# Patient Record
Sex: Female | Born: 1985 | Race: Black or African American | Hispanic: No | Marital: Single | Smoking: Never smoker
Health system: Southern US, Community
[De-identification: ages and names within clinical notes are randomized; demographics above are authoritative.]

## PROBLEM LIST (undated history)

## (undated) DIAGNOSIS — R002 Palpitations: Secondary | ICD-10-CM

## (undated) DIAGNOSIS — L2089 Other atopic dermatitis: Secondary | ICD-10-CM

## (undated) DIAGNOSIS — G43909 Migraine, unspecified, not intractable, without status migrainosus: Secondary | ICD-10-CM

## (undated) DIAGNOSIS — A749 Chlamydial infection, unspecified: Secondary | ICD-10-CM

## (undated) DIAGNOSIS — A549 Gonococcal infection, unspecified: Secondary | ICD-10-CM

## (undated) DIAGNOSIS — E669 Obesity, unspecified: Secondary | ICD-10-CM

## (undated) DIAGNOSIS — G4730 Sleep apnea, unspecified: Secondary | ICD-10-CM

## (undated) DIAGNOSIS — D649 Anemia, unspecified: Secondary | ICD-10-CM

## (undated) HISTORY — DX: Other atopic dermatitis: L20.89

## (undated) HISTORY — DX: Palpitations: R00.2

## (undated) HISTORY — DX: Obesity, unspecified: E66.9

---
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
## 2005-12-29 ENCOUNTER — Emergency Department (HOSPITAL_COMMUNITY): Admission: EM | Admit: 2005-12-29 | Discharge: 2005-12-29 | Payer: Self-pay | Admitting: Emergency Medicine

## 2005-12-29 ENCOUNTER — Emergency Department (HOSPITAL_COMMUNITY): Admission: EM | Admit: 2005-12-29 | Discharge: 2005-12-29 | Payer: Self-pay | Admitting: Family Medicine

## 2006-10-20 ENCOUNTER — Inpatient Hospital Stay (HOSPITAL_COMMUNITY): Admission: AD | Admit: 2006-10-20 | Discharge: 2006-10-20 | Payer: Self-pay

## 2006-10-21 ENCOUNTER — Inpatient Hospital Stay (HOSPITAL_COMMUNITY): Admission: AD | Admit: 2006-10-21 | Discharge: 2006-10-21 | Payer: Self-pay | Admitting: Gynecology

## 2006-12-02 DIAGNOSIS — E6609 Other obesity due to excess calories: Secondary | ICD-10-CM | POA: Insufficient documentation

## 2006-12-02 DIAGNOSIS — E669 Obesity, unspecified: Secondary | ICD-10-CM

## 2006-12-02 DIAGNOSIS — L2089 Other atopic dermatitis: Secondary | ICD-10-CM

## 2006-12-02 HISTORY — DX: Other atopic dermatitis: L20.89

## 2006-12-02 HISTORY — DX: Obesity, unspecified: E66.9

## 2008-01-04 ENCOUNTER — Inpatient Hospital Stay (HOSPITAL_COMMUNITY): Admission: AD | Admit: 2008-01-04 | Discharge: 2008-01-04 | Payer: Self-pay | Admitting: Obstetrics & Gynecology

## 2008-09-06 ENCOUNTER — Emergency Department (HOSPITAL_COMMUNITY): Admission: EM | Admit: 2008-09-06 | Discharge: 2008-09-06 | Payer: Self-pay | Admitting: Emergency Medicine

## 2008-10-05 DIAGNOSIS — O3110X0 Continuing pregnancy after spontaneous abortion of one fetus or more, unspecified trimester, not applicable or unspecified: Secondary | ICD-10-CM

## 2008-10-16 ENCOUNTER — Emergency Department (HOSPITAL_COMMUNITY): Admission: EM | Admit: 2008-10-16 | Discharge: 2008-10-17 | Payer: Self-pay | Admitting: Emergency Medicine

## 2008-10-17 ENCOUNTER — Emergency Department (HOSPITAL_COMMUNITY): Admission: EM | Admit: 2008-10-17 | Discharge: 2008-10-17 | Payer: Self-pay | Admitting: Emergency Medicine

## 2008-11-28 ENCOUNTER — Emergency Department (HOSPITAL_COMMUNITY): Admission: EM | Admit: 2008-11-28 | Discharge: 2008-11-28 | Payer: Self-pay | Admitting: Family Medicine

## 2009-01-22 ENCOUNTER — Ambulatory Visit: Payer: Self-pay | Admitting: Obstetrics and Gynecology

## 2009-01-22 ENCOUNTER — Inpatient Hospital Stay: Admission: AD | Admit: 2009-01-22 | Discharge: 2009-01-22 | Payer: Self-pay | Admitting: Obstetrics & Gynecology

## 2009-08-06 ENCOUNTER — Inpatient Hospital Stay (HOSPITAL_COMMUNITY): Admission: AD | Admit: 2009-08-06 | Discharge: 2009-08-06 | Payer: Self-pay | Admitting: Obstetrics and Gynecology

## 2009-09-27 ENCOUNTER — Inpatient Hospital Stay (HOSPITAL_COMMUNITY): Admission: AD | Admit: 2009-09-27 | Discharge: 2009-09-29 | Payer: Self-pay | Admitting: Obstetrics and Gynecology

## 2010-01-14 ENCOUNTER — Emergency Department (HOSPITAL_COMMUNITY): Admission: EM | Admit: 2010-01-14 | Discharge: 2010-01-14 | Payer: Self-pay | Admitting: Family Medicine

## 2011-01-05 LAB — CBC
HCT: 28 % — ABNORMAL LOW (ref 36.0–46.0)
Hemoglobin: 8.9 g/dL — ABNORMAL LOW (ref 12.0–15.0)
MCHC: 31.6 g/dL (ref 30.0–36.0)
Platelets: 135 10*3/uL — ABNORMAL LOW (ref 150–400)
RBC: 3.73 MIL/uL — ABNORMAL LOW (ref 3.87–5.11)
RBC: 4.64 MIL/uL (ref 3.87–5.11)
WBC: 7.5 10*3/uL (ref 4.0–10.5)

## 2011-01-07 LAB — URINALYSIS, ROUTINE W REFLEX MICROSCOPIC
Glucose, UA: NEGATIVE mg/dL
Protein, ur: NEGATIVE mg/dL
Specific Gravity, Urine: <1.005 — ABNORMAL LOW (ref 1.005–1.030)
Urobilinogen, UA: 0.2 mg/dL (ref 0.0–1.0)
pH: 7 (ref 5.0–8.0)

## 2011-01-07 LAB — URINE MICROSCOPIC-ADD ON

## 2011-01-07 LAB — WET PREP, GENITAL
Clue Cells Wet Prep HPF POC: NONE SEEN
Yeast Wet Prep HPF POC: NONE SEEN

## 2011-01-07 LAB — FETAL FIBRONECTIN: Fetal Fibronectin: NEGATIVE

## 2011-01-20 LAB — POCT URINALYSIS DIP (DEVICE)
Bilirubin Urine: NEGATIVE
Glucose, UA: NEGATIVE mg/dL
Ketones, ur: NEGATIVE mg/dL
Protein, ur: 100 mg/dL — AB
Specific Gravity, Urine: 1.015 (ref 1.005–1.030)

## 2011-01-20 LAB — POCT PREGNANCY, URINE: Preg Test, Ur: NEGATIVE

## 2011-02-22 ENCOUNTER — Inpatient Hospital Stay (HOSPITAL_COMMUNITY)
Admission: AD | Admit: 2011-02-22 | Discharge: 2011-02-23 | Disposition: A | Discharge status: Home / Self Care | Payer: Self-pay | Source: Ambulatory Visit | Attending: Obstetrics & Gynecology | Admitting: Obstetrics & Gynecology

## 2011-02-22 DIAGNOSIS — Z3202 Encounter for pregnancy test, result negative: Secondary | ICD-10-CM

## 2011-03-07 ENCOUNTER — Inpatient Hospital Stay (HOSPITAL_COMMUNITY)
Admission: RE | Admit: 2011-03-07 | Discharge: 2011-03-07 | Disposition: A | Discharge status: Home / Self Care | Payer: Self-pay | Source: Ambulatory Visit | Attending: Family Medicine | Admitting: Family Medicine

## 2011-03-07 ENCOUNTER — Inpatient Hospital Stay (HOSPITAL_COMMUNITY): Payer: Self-pay

## 2011-03-07 ENCOUNTER — Inpatient Hospital Stay (HOSPITAL_COMMUNITY)
Admission: EM | Admit: 2011-03-07 | Discharge: 2011-03-07 | Disposition: A | Discharge status: Home / Self Care | Payer: Self-pay | Source: Ambulatory Visit | Attending: Obstetrics & Gynecology | Admitting: Obstetrics & Gynecology

## 2011-03-07 DIAGNOSIS — R109 Unspecified abdominal pain: Secondary | ICD-10-CM

## 2011-03-07 DIAGNOSIS — D649 Anemia, unspecified: Secondary | ICD-10-CM

## 2011-03-07 LAB — CBC
MCH: 18.5 pg — ABNORMAL LOW (ref 26.0–34.0)
MCHC: 27.7 g/dL — ABNORMAL LOW (ref 30.0–36.0)
Platelets: 284 10*3/uL (ref 150–400)
RBC: 4.64 MIL/uL (ref 3.87–5.11)

## 2011-03-07 LAB — URINALYSIS, ROUTINE W REFLEX MICROSCOPIC
Ketones, ur: NEGATIVE mg/dL
Nitrite: NEGATIVE

## 2011-03-07 LAB — URINE MICROSCOPIC-ADD ON

## 2011-03-07 LAB — WET PREP, GENITAL: Yeast Wet Prep HPF POC: NONE SEEN

## 2011-03-07 LAB — POCT PREGNANCY, URINE: Preg Test, Ur: NEGATIVE

## 2011-03-09 LAB — GC/CHLAMYDIA PROBE AMP, GENITAL: GC Probe Amp, Genital: POSITIVE — AB

## 2011-03-30 ENCOUNTER — Ambulatory Visit: Payer: Self-pay | Admitting: Obstetrics and Gynecology

## 2011-06-30 LAB — GC/CHLAMYDIA PROBE AMP, GENITAL
Chlamydia, DNA Probe: NEGATIVE
GC Probe Amp, Genital: NEGATIVE

## 2011-06-30 LAB — WET PREP, GENITAL
Clue Cells Wet Prep HPF POC: NONE SEEN
Yeast Wet Prep HPF POC: NONE SEEN

## 2011-09-02 ENCOUNTER — Emergency Department (INDEPENDENT_AMBULATORY_CARE_PROVIDER_SITE_OTHER)
Admission: EM | Admit: 2011-09-02 | Discharge: 2011-09-02 | Disposition: A | Discharge status: Home / Self Care | Payer: Self-pay | Source: Home / Self Care | Attending: Family Medicine | Admitting: Family Medicine

## 2011-09-02 DIAGNOSIS — J069 Acute upper respiratory infection, unspecified: Secondary | ICD-10-CM

## 2011-09-02 DIAGNOSIS — H109 Unspecified conjunctivitis: Secondary | ICD-10-CM

## 2011-09-02 MED ORDER — POLYMYXIN B-TRIMETHOPRIM 10000-0.1 UNIT/ML-% OP SOLN: 1.0000 [drp] | OPHTHALMIC | Status: AC

## 2011-09-02 NOTE — ED Notes (Signed)
C/o redness and frequent tearing to lt eye and cold sx since 08/30/11.

## 2011-09-02 NOTE — ED Provider Notes (Signed)
History     CSN: [id number] Arrival date & time: 09/02/2011  8:29 AM   First MD Initiated Contact with Patient 09/02/11 0818      Chief Complaint  Patient presents with  . Eye Problem  . URI    (Consider location/radiation/quality/duration/timing/severity/associated sxs/prior treatment) HPI Comments: Julia Roberts presents for evaluation of redness and tearing in her LEFT eye since yesterday. She reports watery discharge and the eye was difficult to open this morning. She also reports URI sx with nasal congestion, runny nose, cough. She also reports hx of allergies.   Patient is a 25 y.o. female presenting with eye problem and URI. The history is provided by the patient.  Eye Problem  This is a new problem. The current episode started yesterday. The problem occurs constantly. The problem has not changed since onset.There is pain in the left eye. There was no injury mechanism. The patient is experiencing no pain. There is no history of trauma to the eye. There is no known exposure to pink eye. She does not wear contacts. Associated symptoms include discharge and eye redness. Pertinent negatives include no blurred vision, no decreased vision, no double vision and no photophobia. She has tried nothing for the symptoms.  URI The primary symptoms include cough. Primary symptoms do not include sore throat.  Symptoms associated with the illness include congestion and rhinorrhea.    History reviewed. No pertinent past medical history.  History reviewed. No pertinent past surgical history.  No family history on file.  History  Substance Use Topics  . Smoking status: Never Smoker   . Smokeless tobacco: Not on file  . Alcohol Use: No    OB History    Grav Para Term Preterm Abortions TAB SAB Ect Mult Living                  Review of Systems  Constitutional: Negative.   HENT: Positive for congestion, rhinorrhea and sneezing. Negative for sore throat.   Eyes: Positive for discharge and  redness. Negative for blurred vision, double vision, photophobia and visual disturbance.  Respiratory: Positive for cough.   Cardiovascular: Negative.   Gastrointestinal: Negative.   Genitourinary: Negative.   Musculoskeletal: Negative.   Neurological: Negative.     Allergies  Review of patient's allergies indicates no known allergies.  Home Medications   Current Outpatient Rx  Name Route Sig Dispense Refill  . ALEVE PO Oral Take by mouth as needed.        BP 110/70  Pulse 69  Temp(Src) 98.3 F (36.8 C) (Oral)  Resp 16  SpO2 100%  Physical Exam  Nursing note and vitals reviewed. Constitutional: She is oriented to person, place, and time. She appears well-developed and well-nourished.  HENT:  Head: Normocephalic and atraumatic.  Right Ear: Tympanic membrane and external ear normal.  Left Ear: Tympanic membrane and external ear normal.  Mouth/Throat: Uvula is midline, oropharynx is clear and moist and mucous membranes are normal.  Eyes: EOM are normal. Pupils are equal, round, and reactive to light. Left eye exhibits discharge. Left conjunctiva is injected.    Neck: Normal range of motion.  Cardiovascular: Normal rate and regular rhythm.   Pulmonary/Chest: Effort normal and breath sounds normal.  Neurological: She is alert and oriented to person, place, and time.  Skin: Skin is warm and dry.    ED Course  Procedures (including critical care time)  Labs Reviewed - No data to display No results found.   No diagnosis found.  MDM  Likely viral conjunctivitis given constellation of other URI sx and hx of allergies but will cover for bacterial pathogens.        Richardo Priest, MD 09/02/11 [phone]

## 2011-11-01 ENCOUNTER — Emergency Department (HOSPITAL_COMMUNITY): Payer: Self-pay

## 2011-11-01 ENCOUNTER — Encounter (HOSPITAL_COMMUNITY): Payer: Self-pay | Admitting: *Deleted

## 2011-11-01 ENCOUNTER — Emergency Department (HOSPITAL_COMMUNITY)
Admission: EM | Admit: 2011-11-01 | Discharge: 2011-11-01 | Disposition: A | Discharge status: Home / Self Care | Payer: Self-pay | Attending: Emergency Medicine | Admitting: Emergency Medicine

## 2011-11-01 DIAGNOSIS — G43109 Migraine with aura, not intractable, without status migrainosus: Secondary | ICD-10-CM | POA: Insufficient documentation

## 2011-11-01 HISTORY — DX: Migraine, unspecified, not intractable, without status migrainosus: G43.909

## 2011-11-01 MED ORDER — METOCLOPRAMIDE HCL 5 MG/ML IJ SOLN
10.0000 mg | Freq: Once | INTRAMUSCULAR | Status: AC
  Administered 2011-11-01: 10 mg via INTRAMUSCULAR
  Filled 2011-11-01: qty 2

## 2011-11-01 MED ORDER — IBUPROFEN 800 MG PO TABS: 800.0000 mg | ORAL_TABLET | Freq: Three times a day (TID) | ORAL | Status: AC

## 2011-11-01 MED ORDER — KETOROLAC TROMETHAMINE 60 MG/2ML IM SOLN
60.0000 mg | Freq: Once | INTRAMUSCULAR | Status: AC
  Administered 2011-11-01: 60 mg via INTRAMUSCULAR
  Filled 2011-11-01: qty 2

## 2011-11-01 NOTE — ED Provider Notes (Signed)
Medical screening examination/treatment/procedure(s) were conducted as a shared visit with non-physician practitioner(s) and myself.  I personally evaluated the patient during the encounter   Per pt, c/o gradual onset and persistence of constant acute flair of her chronic migraine headache since this morning.  Describes the headache as per her usual chronic migraine headache pain pattern x11 years.  Denies headache was sudden or maximal in onset or at any time.  Has been assoc with nausea and "blurry" vision.  Denies focal motor weakness, no tingling/numbness in extremities, no fevers, no neck pain, no rash.   Hx migraines, typical migraine for her today, seen in ED previously for same.  VSS, afebrile, neck supple, CTA, RRR, neuro exam non-focal, no facial droop, speech clear, normal coordination, MAE without focal deficits.  Tx meds, d/c home.  Pt encouraged to keep headache diary and establish with PMD for continuity of care.    Laray Anger, DO 11/01/11 1951

## 2011-11-01 NOTE — ED Provider Notes (Signed)
History     CSN: [id number]  Arrival date & time 11/01/11  1214   First MD Initiated Contact with Patient 11/01/11 1249      Chief Complaint  Patient presents with  . Blurred Vision  . Headache  . Nausea    (Consider location/radiation/quality/duration/timing/severity/associated sxs/prior treatment) HPI History provided by pt.   Pt has h/o migraines but has never been evaluated by a neurologist nor had a CT head.  Most recent migraine one month ago.  Today she developed blurred vision while at work and then a severe, frontal headache w/ associated dizziness, photophobia and nausea 5 minutes later.  Pain similar to past headaches w/ exception of prodrome of blurred vision.  Denies fever and recent head trauma.   Per prior chart, pt has been seen in ED in the past w/ c/o migraine w/ vision changes.   Past Medical History  Diagnosis Date  . Migraines     History reviewed. No pertinent past surgical history.  No family history on file.  History  Substance Use Topics  . Smoking status: Never Smoker   . Smokeless tobacco: Not on file  . Alcohol Use: No    OB History    Grav Para Term Preterm Abortions TAB SAB Ect Mult Living                  Review of Systems  All other systems reviewed and are negative.    Allergies  Review of patient's allergies indicates no known allergies.  Home Medications  No current outpatient prescriptions on file.  BP 119/42  Pulse 72  Temp(Src) 98.4 F (36.9 C) (Oral)  Resp 20  SpO2 100%  Physical Exam  Nursing note and vitals reviewed. Constitutional: She is oriented to person, place, and time. She appears well-developed and well-nourished. No distress.  HENT:  Head: Normocephalic and atraumatic.  Eyes:       Normal appearance  Neck: Normal range of motion.  Cardiovascular: Normal rate and regular rhythm.   Pulmonary/Chest: Effort normal and breath sounds normal.  Musculoskeletal: Normal range of motion.  Neurological: She  is alert and oriented to person, place, and time. She has normal reflexes. No cranial nerve deficit or sensory deficit. She displays a negative Romberg sign. Coordination and gait normal.       5/5 and equal upper and lower extremity strength.  No past pointing.  Normal gait.   Skin: Skin is warm and dry. No rash noted.  Psychiatric: She has a normal mood and affect. Her behavior is normal.    ED Course  Procedures (including critical care time)  Labs Reviewed - No data to display Ct Head Wo Contrast  11/01/2011  *RADIOLOGY REPORT*  Clinical Data: 26 year old female with acute onset severe headache, blurred vision and nausea.  CT HEAD WITHOUT CONTRAST  Technique:  Contiguous axial images were obtained from the base of the skull through the vertex without contrast.  Comparison: None.  Findings: Mild maxillary and ethmoid sinus mucosal thickening. Frontal sinuses are hypoplastic, the right frontal recess also shows some bubbly opacity.  The mastoids are hypoplastic and otherwise normal. No acute osseous abnormality identified. Visualized orbits and scalp soft tissues are within normal limits. Adenoid hypertrophy is within normal limits for age.  Incidental cavum septum pellucidum.  No ventriculomegaly.  Dural calcifications. No acute intracranial hemorrhage identified.  No midline shift, mass effect, or evidence of mass lesion.  No evidence of cortically based acute infarction identified.  Normal gray-white matter  differentiation throughout the brain. No suspicious intracranial vascular hyperdensity.  IMPRESSION: 1. Normal noncontrast CT appearance of the brain. 2.  Paranasal sinus inflammatory changes, could reflect acute sinusitis in the appropriate clinical setting.  Original Report Authenticated By: Harley Hallmark, M.D.     1. Migraine with aura       MDM  Pt w/ h/o migraines presents w/ typical headache, with exception of prodrome of blurred vision.  No recent head trauma.  On exam, afebrile,  no meningeal signs or focal neuro deficits. Pt received IM reglan for pain/nausea.  CT head ordered because she has never been evaluated for these headaches by a neurologist or PCP and blurred vision reportedly atypical for her.  1:11 PM   CT head neg w/ exception of possible paranasal sinusitis.  Pt has had nasal congestion and facial pressure recently.  I explained that sinusitis in generally viral and no treatment necessary at this time other than sudafed.  She reports that pain is only mildly improved w/ IM reglan.  IM toradol ordered.  Will d/c home w/ ibuprofen and referral to neuro.  Return precautions discussed.  2:46 PM         Otilio Miu, PA 11/01/11 1446

## 2011-11-01 NOTE — ED Notes (Signed)
Pt reports headache is better. From 9/10 to 5/10.

## 2011-11-01 NOTE — ED Notes (Signed)
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 Supervisor brought her to triage and blood pressure was found to be 123/71. Pt reports a history of Migraines but is not being treated for them at this time. Pt thinks she may be having another migraine. Pt alert and oriented x 4, neuro intact. Pt denies photophobia or sensitivity to noise at this time. Pt denies any history of strokes in her family. Pt does not smoke.

## 2011-11-01 NOTE — ED Notes (Signed)
CBG 85

## 2011-11-02 LAB — GLUCOSE, CAPILLARY

## 2012-08-26 ENCOUNTER — Inpatient Hospital Stay (HOSPITAL_COMMUNITY)
Admission: AD | Admit: 2012-08-26 | Discharge: 2012-08-26 | Disposition: A | Discharge status: Home / Self Care | Payer: Self-pay | Source: Ambulatory Visit | Attending: Obstetrics & Gynecology | Admitting: Obstetrics & Gynecology

## 2012-08-26 DIAGNOSIS — Z3202 Encounter for pregnancy test, result negative: Secondary | ICD-10-CM | POA: Insufficient documentation

## 2012-08-26 DIAGNOSIS — R109 Unspecified abdominal pain: Secondary | ICD-10-CM | POA: Insufficient documentation

## 2012-08-26 DIAGNOSIS — N912 Amenorrhea, unspecified: Secondary | ICD-10-CM | POA: Insufficient documentation

## 2012-08-26 LAB — URINALYSIS, ROUTINE W REFLEX MICROSCOPIC
Glucose, UA: NEGATIVE mg/dL
Ketones, ur: NEGATIVE mg/dL
Leukocytes, UA: NEGATIVE
Nitrite: NEGATIVE
Protein, ur: NEGATIVE mg/dL

## 2012-08-26 LAB — POCT PREGNANCY, URINE: Preg Test, Ur: NEGATIVE

## 2012-08-26 NOTE — MAU Provider Note (Signed)
S: Pt presents to MAU for pregnancy test.  Her menses are 2 days late.  She has gained weight recently.  She usually has irregular menses.  She denies other pregnancy symptoms or problems at this time.    O:   BP 124/73  Pulse 67  Temp 98.7 F (37.1 C) (Oral)  Resp 18  Ht 5\' 7"  (1.702 m)  Wt 124.195 kg (273 lb 12.8 oz)  BMI 42.88 kg/m2  SpO2 100%  LMP 07/26/2012   Neg preg test  A: 1. Pregnancy test negative     P: D/C home Discussed test results, signs of pregnancy with pt Take home UPT in 1 week if no onset of menses Return to MAU as needed  Sharen Counter Certified Nurse-Midwife

## 2012-08-26 NOTE — MAU Note (Signed)
Patient states she is late for her period and has been having cramping since yesterday.

## 2012-09-04 NOTE — MAU Provider Note (Signed)
Attestation of Attending Supervision of Advanced Practitioner (CNM/NP): Evaluation and management procedures were performed by the Advanced Practitioner under my supervision and collaboration. I have reviewed the Advanced Practitioner's note and chart, and I agree with the management and plan.  Bartolo Montanye H. 2:51 PM   

## 2013-12-04 ENCOUNTER — Inpatient Hospital Stay (HOSPITAL_COMMUNITY)
Admission: AD | Admit: 2013-12-04 | Discharge: 2013-12-04 | Disposition: A | Discharge status: Home / Self Care | Payer: Medicaid Other | Source: Ambulatory Visit | Attending: Obstetrics & Gynecology | Admitting: Obstetrics & Gynecology

## 2013-12-04 DIAGNOSIS — L293 Anogenital pruritus, unspecified: Secondary | ICD-10-CM | POA: Insufficient documentation

## 2013-12-04 DIAGNOSIS — N898 Other specified noninflammatory disorders of vagina: Secondary | ICD-10-CM | POA: Insufficient documentation

## 2013-12-04 NOTE — MAU Provider Note (Signed)
Attestation of Attending Supervision of Advanced Practitioner (CNM/NP): Evaluation and management procedures were performed by the Advanced Practitioner under my supervision and collaboration.  I have reviewed the Advanced Practitioner's note and chart, and I agree with the management and plan.  HARRAWAY-SMITH, Jahlisa Rossitto 2:49 PM     

## 2013-12-04 NOTE — MAU Note (Signed)
Patient states she has had vaginal itching with a clear/white vaginal discharge. Denies pain or bleeding.

## 2013-12-04 NOTE — MAU Provider Note (Signed)
Ms. Julia Roberts is a 28 y.o. female who presents to MAU today. She states that she has a thick, white discharge with some itching and vaginal irritation. She denies abdominal pain, vaginal bleeding or fever. She would prefer to go to [facility] clinic on 12/06/13 to have this evaluated due to the extended wait time in MAU today.   BP 111/56  Pulse 82  Temp(Src) 98.8 F (37.1 C) (Oral)  Resp 20  Ht 5\' 7"  (1.702 m)  Wt 269 lb 6.4 oz (122.199 kg)  BMI 42.18 kg/m2  SpO2 100%  LMP 11/09/2013 GENERAL: Well-developed, well-nourished female in no acute distress.  HEENT: Normocephalic, atraumatic.   LUNGS: Effort normal HEART: Regular rate  SKIN: Warm, dry and without erythema PSYCH: Normal mood and affect  A: Medical screening exam complete  P: Discharge home Patient has appointment in [facility] clinic on 12/06/13 @ 2:00 pm for further evaluation Patient advised to return to MAU sooner if she develops abdominal pain, vaginal bleeding or fever Patient may return to MAu as needed otherwise  Farris Has, PA-C 12/04/2013 1:14 PM

## 2013-12-06 ENCOUNTER — Encounter (HOSPITAL_COMMUNITY): Payer: Self-pay | Admitting: *Deleted

## 2013-12-06 ENCOUNTER — Encounter: Payer: Medicaid Other | Admitting: Obstetrics & Gynecology

## 2013-12-06 ENCOUNTER — Telehealth: Payer: Self-pay

## 2013-12-06 ENCOUNTER — Inpatient Hospital Stay (HOSPITAL_COMMUNITY)
Admission: AD | Admit: 2013-12-06 | Discharge: 2013-12-06 | Disposition: A | Discharge status: Home / Self Care | Payer: Medicaid Other | Source: Ambulatory Visit | Attending: Obstetrics & Gynecology | Admitting: Obstetrics & Gynecology

## 2013-12-06 DIAGNOSIS — N898 Other specified noninflammatory disorders of vagina: Secondary | ICD-10-CM | POA: Insufficient documentation

## 2013-12-06 DIAGNOSIS — B373 Candidiasis of vulva and vagina: Secondary | ICD-10-CM | POA: Insufficient documentation

## 2013-12-06 DIAGNOSIS — B3731 Acute candidiasis of vulva and vagina: Secondary | ICD-10-CM | POA: Insufficient documentation

## 2013-12-06 DIAGNOSIS — L293 Anogenital pruritus, unspecified: Secondary | ICD-10-CM | POA: Insufficient documentation

## 2013-12-06 HISTORY — DX: Chlamydial infection, unspecified: A74.9

## 2013-12-06 HISTORY — DX: Gonococcal infection, unspecified: A54.9

## 2013-12-06 LAB — URINE MICROSCOPIC-ADD ON

## 2013-12-06 LAB — URINALYSIS, ROUTINE W REFLEX MICROSCOPIC
Bilirubin Urine: NEGATIVE
GLUCOSE, UA: NEGATIVE mg/dL
Hgb urine dipstick: NEGATIVE
Ketones, ur: NEGATIVE mg/dL
Nitrite: NEGATIVE
PH: 6 (ref 5.0–8.0)
Protein, ur: NEGATIVE mg/dL
Specific Gravity, Urine: 1.02 (ref 1.005–1.030)
Urobilinogen, UA: 0.2 mg/dL (ref 0.0–1.0)

## 2013-12-06 LAB — WET PREP, GENITAL
Clue Cells Wet Prep HPF POC: NONE SEEN
Trich, Wet Prep: NONE SEEN

## 2013-12-06 LAB — POCT PREGNANCY, URINE: PREG TEST UR: NEGATIVE

## 2013-12-06 MED ORDER — FLUCONAZOLE 150 MG PO TABS
150.0000 mg | ORAL_TABLET | Freq: Once | ORAL | Status: AC
  Administered 2013-12-06: 150 mg via ORAL
  Filled 2013-12-06: qty 1

## 2013-12-06 MED ORDER — FLUCONAZOLE 150 MG PO TABS: ORAL_TABLET | ORAL | Status: DC

## 2013-12-06 NOTE — Discharge Instructions (Signed)
Candidal Vulvovaginitis  Candidal vulvovaginitis is an infection of the vagina and vulva. The vulva is the skin around the opening of the vagina. This may cause itching and discomfort in and around the vagina.   HOME CARE  · Only take medicine as told by your doctor.  · Do not have sex (intercourse) until the infection is healed or as told by your doctor.  · Practice safe sex.  · Tell your sex partner about your infection.  · Do not douche or use tampons.  · Wear cotton underwear. Do not wear tight pants or panty hose.  · Eat yogurt. This may help treat and prevent yeast infections.  GET HELP RIGHT AWAY IF:   · You have a fever.  · Your problems get worse during treatment or do not get better in 3 days.  · You have discomfort, irritation, or itching in your vagina or vulva area.  · You have pain after sex.  · You start to get belly (abdominal) pain.  MAKE SURE YOU:  · Understand these instructions.  · Will watch your condition.  · Will get help right away if you are not doing well or get worse.  Document Released: 12/18/2008 Document Revised: 12/14/2011 Document Reviewed: 12/18/2008  [employer]® Patient Information ©2014 [employer].

## 2013-12-06 NOTE — Telephone Encounter (Signed)
Pt. Missed todays appointment with Dr. Hulan Fray at 1400.It looks like pt. Was seen in MAU this morning for vaginal discharge, wet prep showed yeast and pt. Was prescribed Diflucan; unsure of the need to follow up today.  Called pt. No answer. Left message stating we saw she missed her appointment, if she continues to have problems or needs a OBGYN please call clinic to reschedule.

## 2013-12-06 NOTE — MAU Provider Note (Signed)
CSN: [id number]     Arrival date & time 12/06/13  1015 History   None    Chief Complaint  Patient presents with  . Vaginal Discharge     (Consider location/radiation/quality/duration/timing/severity/associated sxs/prior Treatment) Patient is a 28 y.o. female presenting with vaginal discharge. The history is provided by the patient.  Vaginal Discharge This is a new problem. The current episode started in the past 7 days. The problem occurs constantly. The problem has been gradually worsening. Pertinent negatives include no abdominal pain, chills, coughing, fever, myalgias, nausea, rash, sore throat, swollen glands or vomiting.   Julia Roberts is a 28 y.o. female who presents to the MAU with vaginal discharge that started 2 days ago. She complains of itching.  She denies any other problems. Current sex partner x 2 years. Hx of GC and Chlamydia.  Past Medical History  Diagnosis Date  . Migraines   . Gonorrhea   . Chlamydia    Past Surgical History  Procedure Laterality Date  . Vaginal delivery  2010   History reviewed. No pertinent family history. History  Substance Use Topics  . Smoking status: Never Smoker   . Smokeless tobacco: Not on file  . Alcohol Use: No   OB History   Grav Para Term Preterm Abortions TAB SAB Ect Mult Living   1 1 1       1      Review of Systems  Constitutional: Negative for fever and chills.  HENT: Negative for sore throat.   Respiratory: Negative for cough.   Gastrointestinal: Negative for nausea, vomiting and abdominal pain.  Genitourinary: Positive for vaginal discharge.  Musculoskeletal: Negative for myalgias.  Skin: Negative for rash.      Allergies  Review of patient's allergies indicates no known allergies.  Home Medications  No current outpatient prescriptions on file. BP 122/48  Pulse 76  Temp(Src) 99 F (37.2 C) (Oral)  Resp 18  Ht 5\' 6"  (1.676 m)  Wt 261 lb (118.389 kg)  BMI 42.15 kg/m2  LMP 11/09/2013 Physical Exam   Nursing note and vitals reviewed. Constitutional: She is oriented to person, place, and time. She appears well-developed and well-nourished.  HENT:  Head: Normocephalic.  Eyes: EOM are normal.  Neck: Neck supple.  Cardiovascular: Normal rate.   Pulmonary/Chest: Effort normal.  Abdominal: Soft. There is no tenderness.  Musculoskeletal: Normal range of motion.  Neurological: She is alert and oriented to person, place, and time. No cranial nerve deficit.  Skin: Skin is warm and dry.  Psychiatric: She has a normal mood and affect. Her behavior is normal.   Results for orders placed during the hospital encounter of 12/06/13 (from the past 24 hour(s))  URINALYSIS, ROUTINE W REFLEX MICROSCOPIC     Status: Abnormal   Collection Time    12/06/13 10:30 AM      Result Value Ref Range   Color, Urine YELLOW  YELLOW   APPearance CLEAR  CLEAR   Specific Gravity, Urine 1.020  1.005 - 1.030   pH 6.0  5.0 - 8.0   Glucose, UA NEGATIVE  NEGATIVE mg/dL   Hgb urine dipstick NEGATIVE  NEGATIVE   Bilirubin Urine NEGATIVE  NEGATIVE   Ketones, ur NEGATIVE  NEGATIVE mg/dL   Protein, ur NEGATIVE  NEGATIVE mg/dL   Urobilinogen, UA 0.2  0.0 - 1.0 mg/dL   Nitrite NEGATIVE  NEGATIVE   Leukocytes, UA SMALL (*) NEGATIVE  URINE MICROSCOPIC-ADD ON     Status: Abnormal   Collection Time  12/06/13 10:30 AM      Result Value Ref Range   Squamous Epithelial / LPF FEW (*) RARE   WBC, UA 3-6  <3 WBC/hpf   RBC / HPF 0-2  <3 RBC/hpf   Bacteria, UA FEW (*) RARE   Urine-Other MUCOUS PRESENT    POCT PREGNANCY, URINE     Status: None   Collection Time    12/06/13 11:20 AM      Result Value Ref Range   Preg Test, Ur NEGATIVE  NEGATIVE  WET PREP, GENITAL     Status: Abnormal   Collection Time    12/06/13 11:36 AM      Result Value Ref Range   Yeast Wet Prep HPF POC MODERATE (*) NONE SEEN   Trich, Wet Prep NONE SEEN  NONE SEEN   Clue Cells Wet Prep HPF POC NONE SEEN  NONE SEEN   WBC, Wet Prep HPF POC FEW (*)  NONE SEEN    ED Course  Procedures   MDM  28 y.o. female with vaginal itching and discharge x 2 days. Will treat with diflucan for yeast. Patient to follow up in [facility]. Stable for discharge without further screening at this time. GC, Chlamydia cultures pending. Discussed with the patient and all questioned fully answered. She will return if any problems arise.

## 2013-12-06 NOTE — MAU Note (Signed)
Vag d/c and itching started 2 days ago

## 2013-12-07 LAB — GC/CHLAMYDIA PROBE AMP
CT PROBE, AMP APTIMA: NEGATIVE
GC PROBE AMP APTIMA: NEGATIVE

## 2014-08-06 ENCOUNTER — Encounter (HOSPITAL_COMMUNITY): Payer: Self-pay | Admitting: *Deleted

## 2015-01-15 ENCOUNTER — Encounter (HOSPITAL_COMMUNITY): Payer: Self-pay | Admitting: Emergency Medicine

## 2015-01-15 ENCOUNTER — Emergency Department (HOSPITAL_COMMUNITY)
Admission: EM | Admit: 2015-01-15 | Discharge: 2015-01-15 | Disposition: A | Discharge status: Home / Self Care | Payer: Medicaid Other | Attending: Emergency Medicine | Admitting: Emergency Medicine

## 2015-01-15 DIAGNOSIS — Z79899 Other long term (current) drug therapy: Secondary | ICD-10-CM | POA: Diagnosis not present

## 2015-01-15 DIAGNOSIS — Z8619 Personal history of other infectious and parasitic diseases: Secondary | ICD-10-CM | POA: Insufficient documentation

## 2015-01-15 DIAGNOSIS — R6883 Chills (without fever): Secondary | ICD-10-CM | POA: Diagnosis not present

## 2015-01-15 DIAGNOSIS — R197 Diarrhea, unspecified: Secondary | ICD-10-CM | POA: Diagnosis not present

## 2015-01-15 DIAGNOSIS — R1084 Generalized abdominal pain: Secondary | ICD-10-CM | POA: Diagnosis present

## 2015-01-15 DIAGNOSIS — D649 Anemia, unspecified: Secondary | ICD-10-CM | POA: Insufficient documentation

## 2015-01-15 DIAGNOSIS — Z8679 Personal history of other diseases of the circulatory system: Secondary | ICD-10-CM | POA: Insufficient documentation

## 2015-01-15 DIAGNOSIS — Z3202 Encounter for pregnancy test, result negative: Secondary | ICD-10-CM | POA: Insufficient documentation

## 2015-01-15 HISTORY — DX: Anemia, unspecified: D64.9

## 2015-01-15 LAB — CBC WITH DIFFERENTIAL/PLATELET
BASOS ABS: 0 10*3/uL (ref 0.0–0.1)
BASOS PCT: 0 % (ref 0–1)
EOS ABS: 0.1 10*3/uL (ref 0.0–0.7)
Eosinophils Relative: 1 % (ref 0–5)
HEMATOCRIT: 32.2 % — AB (ref 36.0–46.0)
HEMOGLOBIN: 9.2 g/dL — AB (ref 12.0–15.0)
LYMPHS ABS: 1.3 10*3/uL (ref 0.7–4.0)
LYMPHS PCT: 17 % (ref 12–46)
MCH: 19.5 pg — AB (ref 26.0–34.0)
MCHC: 28.6 g/dL — AB (ref 30.0–36.0)
MCV: 68.1 fL — AB (ref 78.0–100.0)
MONO ABS: 0.5 10*3/uL (ref 0.1–1.0)
Monocytes Relative: 6 % (ref 3–12)
NEUTROS ABS: 6 10*3/uL (ref 1.7–7.7)
Neutrophils Relative %: 76 % (ref 43–77)
Platelets: 313 10*3/uL (ref 150–400)
RBC: 4.73 MIL/uL (ref 3.87–5.11)
RDW: 16.1 % — AB (ref 11.5–15.5)
WBC: 7.9 10*3/uL (ref 4.0–10.5)

## 2015-01-15 LAB — COMPREHENSIVE METABOLIC PANEL
ALBUMIN: 4.2 g/dL (ref 3.5–5.2)
ALK PHOS: 59 U/L (ref 39–117)
ALT: 20 U/L (ref 0–35)
ANION GAP: 8 (ref 5–15)
AST: 22 U/L (ref 0–37)
BILIRUBIN TOTAL: 0.7 mg/dL (ref 0.3–1.2)
BUN: 6 mg/dL (ref 6–23)
CHLORIDE: 104 mmol/L (ref 96–112)
CO2: 23 mmol/L (ref 19–32)
Calcium: 9.8 mg/dL (ref 8.4–10.5)
Creatinine, Ser: 0.66 mg/dL (ref 0.50–1.10)
GFR calc non Af Amer: >90 mL/min (ref >90)
GLUCOSE: 95 mg/dL (ref 70–99)
POTASSIUM: 4.1 mmol/L (ref 3.5–5.1)
Sodium: 135 mmol/L (ref 135–145)
Total Protein: 7.5 g/dL (ref 6.0–8.3)

## 2015-01-15 LAB — URINALYSIS, ROUTINE W REFLEX MICROSCOPIC
BILIRUBIN URINE: NEGATIVE
Glucose, UA: NEGATIVE mg/dL
Hgb urine dipstick: NEGATIVE
Ketones, ur: NEGATIVE mg/dL
NITRITE: NEGATIVE
PH: 7 (ref 5.0–8.0)
Protein, ur: NEGATIVE mg/dL
Specific Gravity, Urine: 1.017 (ref 1.005–1.030)
Urobilinogen, UA: 0.2 mg/dL (ref 0.0–1.0)

## 2015-01-15 LAB — URINE MICROSCOPIC-ADD ON

## 2015-01-15 LAB — PREGNANCY, URINE: Preg Test, Ur: NEGATIVE

## 2015-01-15 LAB — POC URINE PREG, ED: Preg Test, Ur: NEGATIVE

## 2015-01-15 LAB — LIPASE, BLOOD: Lipase: 21 U/L (ref 11–59)

## 2015-01-15 MED ORDER — ONDANSETRON 4 MG PO TBDP: 4.0000 mg | ORAL_TABLET | Freq: Three times a day (TID) | ORAL | Status: DC | PRN

## 2015-01-15 MED ORDER — ONDANSETRON HCL 4 MG/2ML IJ SOLN
4.0000 mg | Freq: Once | INTRAMUSCULAR | Status: AC
  Administered 2015-01-15: 4 mg via INTRAVENOUS
  Filled 2015-01-15: qty 2

## 2015-01-15 MED ORDER — SODIUM CHLORIDE 0.9 % IV BOLUS (SEPSIS)
1000.0000 mL | Freq: Once | INTRAVENOUS | Status: AC
  Administered 2015-01-15: 1000 mL via INTRAVENOUS

## 2015-01-15 NOTE — ED Notes (Signed)
Diarr since 0300 x 3; one episode of blood. Lower GI cramping. Thinks may have eaten something bad. Denies vomiting or nausea.

## 2015-01-15 NOTE — ED Provider Notes (Signed)
CSN: [id number]     Arrival date & time 01/15/15  0944 History   First MD Initiated Contact with Patient 01/15/15 1140     Chief Complaint  Patient presents with  . Abdominal Pain     (Consider location/radiation/quality/duration/timing/severity/associated sxs/prior Treatment) Patient is a 29 y.o. female presenting with abdominal pain. The history is provided by the patient. No language interpreter was used.  Abdominal Pain Pain location:  Generalized Pain quality: aching   Pain radiates to:  Does not radiate Pain severity:  Moderate Onset quality:  Gradual Timing:  Constant Progression:  Worsening Chronicity:  New Relieved by:  Nothing Worsened by:  Nothing tried Ineffective treatments:  None tried Associated symptoms: chills   Associated symptoms: no fever   Risk factors: has not had multiple surgeries and not pregnant     Past Medical History  Diagnosis Date  . Migraines   . Gonorrhea   . Chlamydia   . Anemia    Past Surgical History  Procedure Laterality Date  . Vaginal delivery  2010   History reviewed. No pertinent family history. History  Substance Use Topics  . Smoking status: Never Smoker   . Smokeless tobacco: Not on file  . Alcohol Use: No   OB History    Gravida Para Term Preterm AB TAB SAB Ectopic Multiple Living   1 1 1       1      Review of Systems  Constitutional: Positive for chills. Negative for fever.  Gastrointestinal: Positive for abdominal pain.  All other systems reviewed and are negative.     Allergies  Review of patient's allergies indicates no known allergies.  Home Medications   Prior to Admission medications   Medication Sig Start Date End Date Taking? Authorizing Provider  Aspirin-Acetaminophen-Caffeine (EXCEDRIN PO) Take 2 tablets by mouth every 8 (eight) hours as needed (headache).   Yes Historical Provider, MD  ferrous sulfate 325 (65 FE) MG tablet Take 325 mg by mouth daily with breakfast.   Yes Historical Provider,  MD  fluconazole (DIFLUCAN) 150 MG tablet Take this tablet PO on 12/09/2013 Patient not taking: Reported on 01/15/2015 12/06/13   Ashley Murrain, NP   BP 121/57 mmHg  Pulse 74  Temp(Src) 98.6 F (37 C) (Oral)  Resp 16  SpO2 100%  LMP 01/07/2015 Physical Exam  Constitutional: She is oriented to person, place, and time. She appears well-developed and well-nourished.  HENT:  Head: Normocephalic and atraumatic.  Right Ear: External ear normal.  Left Ear: External ear normal.  Nose: Nose normal.  Mouth/Throat: Oropharynx is clear and moist.  Eyes: EOM are normal. Pupils are equal, round, and reactive to light.  Neck: Normal range of motion.  Cardiovascular: Normal rate and normal heart sounds.   Pulmonary/Chest: Effort normal.  Abdominal: Soft. She exhibits no distension.  Musculoskeletal: Normal range of motion.  Neurological: She is alert and oriented to person, place, and time.  Skin: Skin is warm.  Psychiatric: She has a normal mood and affect.  Nursing note and vitals reviewed.   ED Course  Procedures (including critical care time) Labs Review Labs Reviewed  CBC WITH DIFFERENTIAL/PLATELET - Abnormal; Notable for the following:    Hemoglobin 9.2 (*)    HCT 32.2 (*)    MCV 68.1 (*)    MCH 19.5 (*)    MCHC 28.6 (*)    RDW 16.1 (*)    All other components within normal limits  URINALYSIS, ROUTINE W REFLEX MICROSCOPIC - Abnormal; Notable  for the following:    Leukocytes, UA SMALL (*)    All other components within normal limits  URINE MICROSCOPIC-ADD ON - Abnormal; Notable for the following:    Squamous Epithelial / LPF MANY (*)    Bacteria, UA FEW (*)    All other components within normal limits  COMPREHENSIVE METABOLIC PANEL  LIPASE, BLOOD  PREGNANCY, URINE  POC URINE PREG, ED   Results for orders placed or performed during the hospital encounter of 01/15/15  Comprehensive metabolic panel  Result Value Ref Range   Sodium 135 135 - 145 mmol/L   Potassium 4.1 3.5 - 5.1  mmol/L   Chloride 104 96 - 112 mmol/L   CO2 23 19 - 32 mmol/L   Glucose, Bld 95 70 - 99 mg/dL   BUN 6 6 - 23 mg/dL   Creatinine, Ser 0.66 0.50 - 1.10 mg/dL   Calcium 9.8 8.4 - 10.5 mg/dL   Total Protein 7.5 6.0 - 8.3 g/dL   Albumin 4.2 3.5 - 5.2 g/dL   AST 22 0 - 37 U/L   ALT 20 0 - 35 U/L   Alkaline Phosphatase 59 39 - 117 U/L   Total Bilirubin 0.7 0.3 - 1.2 mg/dL   GFR calc non Af Amer >90 >90 mL/min   GFR calc Af Amer >90 >90 mL/min   Anion gap 8 5 - 15  CBC with Differential  Result Value Ref Range   WBC 7.9 4.0 - 10.5 K/uL   RBC 4.73 3.87 - 5.11 MIL/uL   Hemoglobin 9.2 (L) 12.0 - 15.0 g/dL   HCT 32.2 (L) 36.0 - 46.0 %   MCV 68.1 (L) 78.0 - 100.0 fL   MCH 19.5 (L) 26.0 - 34.0 pg   MCHC 28.6 (L) 30.0 - 36.0 g/dL   RDW 16.1 (H) 11.5 - 15.5 %   Platelets 313 150 - 400 K/uL   Neutrophils Relative % 76 43 - 77 %   Lymphocytes Relative 17 12 - 46 %   Monocytes Relative 6 3 - 12 %   Eosinophils Relative 1 0 - 5 %   Basophils Relative 0 0 - 1 %   Neutro Abs 6.0 1.7 - 7.7 K/uL   Lymphs Abs 1.3 0.7 - 4.0 K/uL   Monocytes Absolute 0.5 0.1 - 1.0 K/uL   Eosinophils Absolute 0.1 0.0 - 0.7 K/uL   Basophils Absolute 0.0 0.0 - 0.1 K/uL   RBC Morphology POLYCHROMASIA PRESENT   Lipase, blood  Result Value Ref Range   Lipase 21 11 - 59 U/L  Urinalysis, Routine w reflex microscopic  Result Value Ref Range   Color, Urine YELLOW YELLOW   APPearance CLEAR CLEAR   Specific Gravity, Urine 1.017 1.005 - 1.030   pH 7.0 5.0 - 8.0   Glucose, UA NEGATIVE NEGATIVE mg/dL   Hgb urine dipstick NEGATIVE NEGATIVE   Bilirubin Urine NEGATIVE NEGATIVE   Ketones, ur NEGATIVE NEGATIVE mg/dL   Protein, ur NEGATIVE NEGATIVE mg/dL   Urobilinogen, UA 0.2 0.0 - 1.0 mg/dL   Nitrite NEGATIVE NEGATIVE   Leukocytes, UA SMALL (A) NEGATIVE  Pregnancy, urine  Result Value Ref Range   Preg Test, Ur NEGATIVE NEGATIVE  Urine microscopic-add on  Result Value Ref Range   Squamous Epithelial / LPF MANY (A) RARE    WBC, UA 3-6 <3 WBC/hpf   RBC / HPF 0-2 <3 RBC/hpf   Bacteria, UA FEW (A) RARE  POC Urine Pregnancy, ED (do NOT order at [facility])  Result Value  Ref Range   Preg Test, Ur NEGATIVE NEGATIVE   No results found.  Imaging Review No results found.   EKG Interpretation None      MDM Pt has a history of anemia.  Pt reports she has lower abdominal crampiing. Pt reports she had diarrhea earlier.   Pt given Iv fluids and zofran.  Pt reports she feels better.     Final diagnoses:  Diarrhea    AVS Zofran odt.     Hollace Kinnier [place], PA-C 01/15/15 [place], MD 01/15/15 1454

## 2015-01-15 NOTE — Discharge Instructions (Signed)
Diarrhea °Diarrhea is frequent loose and watery bowel movements. It can cause you to feel weak and dehydrated. Dehydration can cause you to become tired and thirsty, have a dry mouth, and have decreased urination that often is dark yellow. Diarrhea is a sign of another problem, most often an infection that will not last long. In most cases, diarrhea typically lasts 2-3 days. However, it can last longer if it is a sign of something more serious. It is important to treat your diarrhea as directed by your caregiver to lessen or prevent future episodes of diarrhea. °CAUSES  °Some common causes include: °· Gastrointestinal infections caused by viruses, bacteria, or parasites. °· Food poisoning or food allergies. °· Certain medicines, such as antibiotics, chemotherapy, and laxatives. °· Artificial sweeteners and fructose. °· Digestive disorders. °HOME CARE INSTRUCTIONS °· Ensure adequate fluid intake (hydration): Have 1 cup (8 oz) of fluid for each diarrhea episode. Avoid fluids that contain simple sugars or sports drinks, fruit juices, whole milk products, and sodas. Your urine should be clear or pale yellow if you are drinking enough fluids. Hydrate with an oral rehydration solution that you can purchase at pharmacies, retail stores, and online. You can prepare an oral rehydration solution at home by mixing the following ingredients together: °¨  - tsp table salt. °¨ ¾ tsp baking soda. °¨  tsp salt substitute containing potassium chloride. °¨ 1  tablespoons sugar. °¨ 1 L (34 oz) of water. °· Certain foods and beverages may increase the speed at which food moves through the gastrointestinal (GI) tract. These foods and beverages should be avoided and include: °¨ Caffeinated and alcoholic beverages. °¨ High-fiber foods, such as raw fruits and vegetables, nuts, seeds, and whole grain breads and cereals. °¨ Foods and beverages sweetened with sugar alcohols, such as xylitol, sorbitol, and mannitol. °· Some foods may be well  tolerated and may help thicken stool including: °¨ Starchy foods, such as rice, toast, pasta, low-sugar cereal, oatmeal, grits, baked potatoes, crackers, and bagels. °¨ Bananas. °¨ Applesauce. °· Add probiotic-rich foods to help increase healthy bacteria in the GI tract, such as yogurt and fermented milk products. °· Wash your hands well after each diarrhea episode. °· Only take over-the-counter or prescription medicines as directed by your caregiver. °· Take a warm bath to relieve any burning or pain from frequent diarrhea episodes. °SEEK IMMEDIATE MEDICAL CARE IF:  °· You are unable to keep fluids down. °· You have persistent vomiting. °· You have blood in your stool, or your stools are black and tarry. °· You do not urinate in 6-8 hours, or there is only a small amount of very dark urine. °· You have abdominal pain that increases or localizes. °· You have weakness, dizziness, confusion, or light-headedness. °· You have a severe headache. °· Your diarrhea gets worse or does not get better. °· You have a fever or persistent symptoms for more than 2-3 days. °· You have a fever and your symptoms suddenly get worse. °MAKE SURE YOU:  °· Understand these instructions. °· Will watch your condition. °· Will get help right away if you are not doing well or get worse. °Document Released: 09/11/2002 Document Revised: 02/05/2014 Document Reviewed: 05/29/2012 °ExitCare® Patient Information ©2015 [employer]. This information is not intended to replace advice given to you by your health care provider. Make sure you discuss any questions you have with your health care provider. ° ° °Emergency Department Resource Guide °1) Find a Doctor and Pay Out of Pocket °Although you   won't have to find out who is covered by your insurance plan, it is a good idea to ask around and get recommendations. You will then need to call the office and see if the doctor you have chosen will accept you as a new patient and what types of options they  offer for patients who are self-pay. Some doctors offer discounts or will set up payment plans for their patients who do not have insurance, but you will need to ask so you aren't surprised when you get to your appointment. ° °2) Contact Your Local Health Department °Not all health departments have doctors that can see patients for sick visits, but many do, so it is worth a call to see if yours does. If you don't know where your local health department is, you can check in your phone book. The CDC also has a tool to help you locate your state's health department, and many state websites also have listings of all of their local health departments. ° °3) Find a Walk-in Clinic °If your illness is not likely to be very severe or complicated, you may want to try a walk in clinic. These are popping up all over the country in pharmacies, drugstores, and shopping centers. They're usually staffed by nurse practitioners or physician assistants that have been trained to treat common illnesses and complaints. They're usually fairly quick and inexpensive. However, if you have serious medical issues or chronic medical problems, these are probably not your best option. ° °No Primary Care Doctor: °- Call [facility] at  832-8000 - they can help you locate a primary care doctor that  accepts your insurance, provides certain services, etc. °- Physician Referral Service- 1-800-533-3463 ° °Chronic Pain Problems: °Organization         Address  Phone   Notes  °[facility]  ([phone]) [phone] Patients need to be referred by their primary care doctor.  ° °Medication Assistance: °Organization         Address  Phone   Notes  °[place] [facility] [address] °[place], [place] [address] (336) 641-8030 --Must be a resident of [place] °-- Must have NO insurance coverage whatsoever (no Medicaid/ Medicare, etc.) °-- The pt. MUST have a primary care doctor that directs their care  regularly and follows them in the community °  °[facility]  (866) 331-1348   °[employer]  (888) 892-1162   ° °Agencies that provide inexpensive medical care: °Organization         Address  Phone   Notes  °[facility]  (336) 832-8035   °[facility]    (336) 832-7272   °[facility] [address] °[place], [place] [address] (336) 832-4777   °[facility] [address], °[place] (336) 271-4999   °[facility]    (336) 373-0678   °[facility]    (336) 272-1050   °[facility] ° [address], [place] Phone:  (336) 832-4444, Fax:  (336) 832-4440 Hours of Operation:  9 am - 6 pm, M-F.  Also accepts Medicaid/Medicare and self-pay.  °[facility] ° [address], [place] Phone: (336) [phone], Fax: (336) [phone]. Hours of Operation:  8:30 am - 5:30 pm, M-F.  Also accepts Medicaid and self-pay.  °[facility] [place] [address], [place] Phone: (336) 878-6027   °[facility] [address], [place], [place] (336)723-1848, Ext. 123 Mondays & Thursdays: 7-9 AM.  First 15 patients are seen on a first come, first serve basis. °  ° °Medicaid-accepting [place] Providers: ° °Organization         Address  Phone   Notes  °[facility] [address], [place] (336) 641-2100 Also accepts self-pay patients.  °[facility] [address], [place] ° (336) 856-9996   °[facility] [address], [place] (336) 288-8857   °[facility] [address] [place] [address], [place] (336) 299-7000   °Veita Bland [address], [place]  ° (336) 373-1557 Only accepts [place] Access Medicaid patients after they have their name applied to their card.  ° °Self-Pay (no insurance) in [place]: ° °Organization         Address  Phone    Notes  °Sickle Cell Patients, [facility] [address], [place] (336) 832-1970   °[facility] [address], [place] (336) 832-4400   ° [facility] ° [address], [place] (336) 992-4800   °[facility]/Dr. Osei-Bonsu ° [address] [place] [address], [place] or [address], [place] (336) [phone] Phone number for both [place] and [place] locations is the same.  °[facility] [address], [place] (336) 299-0000   °[facility] [address] [place] [address], [place] or [address] [place] [address] (336) 852-7530 °(336) 878-2260   °[facility] [address], [place] (336) 350-1642, phone; (336) 294-5005, fax Sees patients 1st and 3rd Saturday of every month.  Must not qualify for public or private insurance (i.e. Medicaid, Medicare, [place] Health Choice, Veterans' Benefits) • Household income should be no more than 200% of the poverty level •The clinic cannot treat you if you are pregnant or think you are pregnant • Sexually transmitted diseases are not treated at the clinic.  ° ° °Dental Care: °Organization         Address  Phone  Notes  °[facility] [address], [place] (336) 641-6152 Accepts children up to age 21 who are enrolled in Medicaid or [place] Health Choice; pregnant women with a Medicaid card; and children who have applied for Medicaid or [place] Health Choice, but were declined, whose parents can pay a reduced fee at time of service.  °[facility] [place]  [address], [place] (336) 641-7733 Accepts children up to age 21 who are enrolled in Medicaid or [place] Health Choice; pregnant women with a Medicaid card; and children who have applied for Medicaid or [place] Health Choice, but were declined, whose parents can pay a reduced fee at time of service.  °[facility] ° [address], [place] (336) 641-4533 Patients are seen by appointment only. Walk-ins are not accepted. [facility] will see patients 18 years of age and older. °Monday - Tuesday (8am-5pm) °Most Wednesdays (8:30-5pm) °$30 per visit, cash only  °[facility] ° [address], [place] (336) 641-4533 Patients are seen by appointment only. Walk-ins are not accepted. [facility] will see patients 18 years of age and older. °One Wednesday Evening (Monthly: Volunteer Based).  $30 per visit, cash only  °[facility]  (919) 537-3737 for adults; Children under age 4, call Graduate Pediatric Dentistry at ([phone]) [phone]. Children aged 4-14, please call ([phone]) [phone] to request a pediatric application. ° Dental services are provided in all areas of dental care including fillings, crowns and bridges, complete and partial dentures, implants, gum treatment, root canals, and extractions. Preventive care is also provided. Treatment is provided to both adults and children. °Patients are selected via a lottery and there is often a waiting list. °  °[facility] [address], °[place] ° (336) 763-8833 [email or web address] °  °[facility] [address], [place], [place] (336)723-1848, Ext. 123 Second and Fourth Thursday of each month, opens at 6:30 AM; Clinic ends at 9 AM.  Patients are seen on a first-come first-served basis, and a limited number are seen during each clinic.  ° °[facility] ° [address], [place], [place] (336) 723-7904   Eligibility Requirements °You must have lived in [place], [place], or [place] counties for at least the last three months. °  You cannot be eligible for state or federal sponsored healthcare insurance, including Veterans Administration, Medicaid, or Medicare. °  You generally cannot be eligible for healthcare insurance through your employer.  °  How to  apply: °Eligibility screenings are held every Tuesday and Wednesday afternoon from 1:00 pm until 4:00 pm. You do not need an appointment for the interview!  °[facility] [address], [place], [place] 336-631-2330   °[facility] [facility]  336-342-8273   °[facility]  336-703-3100   °[facility]  336-570-6415   ° °Behavioral Health Resources in the Community: °Intensive Outpatient Programs °Organization         Address  Phone  Notes  °[facility] [address], [place], [place] 336-878-6098   °[facility] [address], [place], [place] 336-832-9800   °[facility] [address], [place], [place] ° 336-882-2125   °[facility] [address],  °[place], [place] 1-800-853-5163 or 336-641-4981   °Substance Abuse Resources °Organization         Address  Phone  Notes  °[facility]  336-882-2125   °[facility]  336-784-9470   °[facility]  336-285-9073   °[facility]  336-845-3988   °Residential & Outpatient Substance Abuse Program  1-800-659-3381   °Psychological Services °Organization         Address  Phone  Notes  °[facility]  336- 832-9600   °[facility]  336- 378-7881   °[facility] [address], [place] 1-800-853-5163 or 336-641-4981   ° °Mobile Crisis Teams °Organization         Address  Phone  Notes  °[facility], Mobile Crisis Care Unit  1-877-626-1772   °Assertive °[facility] ° [address] [place], [place] 336-834-9664   °Sharon DeEsch [address] °[place] 336-554-5454   ° °Self-Help/Support Groups °Organization         Address  Phone             Notes  °[facility] [place] - variety of support groups  336- 373-1402 Call for more information  °Narcotics Anonymous (NA), [facility] [address], °[place]  2 meetings at this location  ° °Residential Treatment Programs °Organization           Address  Phone  Notes  °[facility] [address],    °[place]  1-866-801-8205   °[facility] ° [address], [place], [place] 704-293-8524   °[facility] [address], [place] 336-845-3988 Admissions: 8am-3pm M-F  °[facility] [address]    °[place], [place] 336-841-1104   °[facility] [address], [place], [place] 336-379-7146   °[facility] [address]  °[place], [place] 336-285-9073   °[facility] - Intensive Outpatient [address], [place], [place] 336-852-3033   °[facility] [address]  °[place], [place] 1-877-615-2722 or 336-784-9470   °[facility] [address] [place], [place] 336-227-7417 Accepts Medicaid  °[facility] [address]  °[place] 1-800-659-3381 Substance Abuse/Addiction Treatment  ° °[place] Behavioral Health Resources °Organization         Address  Phone  Notes  °[facility]  (888) 581-9988   °Julie Brannon, PhD [address] [place], [place]   (336) 349-5553 or (336) 951-0000   °[facility]   [address] °[place], [place] (336) 349-4454   °[facility] [address], [place], [place] (336) 342-8316 Insurance/Medicaid/sponsorship through [facility]  °[facility] [address]                                    [place], [place] (336) 342-8316 Therapy/tele-psych/case  °[facility] [address]  ° [place], [place] (336) 349-2233    °Dr. Arfeen  (336) 349-4544   °[facility] [place]  [employer] [place] [facility] 1) [address], [place] °2) [address], [place] °3)  [address], [place] (336) 349-3220 °(336) 342-7768 ° °(336) 342-8140   °[place] [facility]  (336) 342-1394 or (336) 342-3537 (After Hours)    ° °  °

## 2015-12-10 ENCOUNTER — Emergency Department (HOSPITAL_COMMUNITY)
Admission: EM | Admit: 2015-12-10 | Discharge: 2015-12-10 | Disposition: A | Discharge status: Home / Self Care | Payer: Medicaid Other | Attending: Emergency Medicine | Admitting: Emergency Medicine

## 2015-12-10 ENCOUNTER — Encounter (HOSPITAL_COMMUNITY): Payer: Self-pay | Admitting: Emergency Medicine

## 2015-12-10 DIAGNOSIS — S39012A Strain of muscle, fascia and tendon of lower back, initial encounter: Secondary | ICD-10-CM | POA: Diagnosis not present

## 2015-12-10 DIAGNOSIS — Z79899 Other long term (current) drug therapy: Secondary | ICD-10-CM | POA: Diagnosis not present

## 2015-12-10 DIAGNOSIS — Z7982 Long term (current) use of aspirin: Secondary | ICD-10-CM | POA: Diagnosis not present

## 2015-12-10 DIAGNOSIS — X501XXA Overexertion from prolonged static or awkward postures, initial encounter: Secondary | ICD-10-CM | POA: Diagnosis not present

## 2015-12-10 DIAGNOSIS — Z8679 Personal history of other diseases of the circulatory system: Secondary | ICD-10-CM | POA: Insufficient documentation

## 2015-12-10 DIAGNOSIS — D649 Anemia, unspecified: Secondary | ICD-10-CM | POA: Insufficient documentation

## 2015-12-10 DIAGNOSIS — S3992XA Unspecified injury of lower back, initial encounter: Secondary | ICD-10-CM | POA: Diagnosis present

## 2015-12-10 DIAGNOSIS — Y9389 Activity, other specified: Secondary | ICD-10-CM | POA: Insufficient documentation

## 2015-12-10 DIAGNOSIS — Z8619 Personal history of other infectious and parasitic diseases: Secondary | ICD-10-CM | POA: Insufficient documentation

## 2015-12-10 DIAGNOSIS — Y9289 Other specified places as the place of occurrence of the external cause: Secondary | ICD-10-CM | POA: Diagnosis not present

## 2015-12-10 DIAGNOSIS — Y990 Civilian activity done for income or pay: Secondary | ICD-10-CM | POA: Diagnosis not present

## 2015-12-10 MED ORDER — OXYCODONE-ACETAMINOPHEN 5-325 MG PO TABS: 2.0000 | ORAL_TABLET | ORAL | Status: DC | PRN

## 2015-12-10 MED ORDER — CYCLOBENZAPRINE HCL 10 MG PO TABS: 10.0000 mg | ORAL_TABLET | Freq: Two times a day (BID) | ORAL | Status: DC | PRN

## 2015-12-10 MED ORDER — OXYCODONE-ACETAMINOPHEN 5-325 MG PO TABS
1.0000 | ORAL_TABLET | Freq: Once | ORAL | Status: AC
  Administered 2015-12-10: 1 via ORAL
  Filled 2015-12-10: qty 1

## 2015-12-10 NOTE — ED Notes (Signed)
See PA note for secondary assessment.   

## 2015-12-10 NOTE — Discharge Instructions (Signed)

## 2015-12-10 NOTE — ED Notes (Signed)
No answer in waiting room 

## 2015-12-10 NOTE — ED Notes (Signed)
Pt c/o back pain, was at work, bent over stood up and started having a sharp pain in mid lower back, radiates down to thighs. VSS. Took tylenol for the pain, didn't help. Denies N/v/d, or urinary complaints.

## 2015-12-10 NOTE — ED Notes (Signed)
No response from patient in waiting room to come back to a room in fast track

## 2015-12-10 NOTE — ED Provider Notes (Signed)
CSN: [id number]     Arrival date & time 12/10/15  1745 History  By signing my name below, I, Altamease Oiler, attest that this documentation has been prepared under the direction and in the presence of Lovie Agresta. Electronically Signed: Altamease Oiler, ED Scribe. 12/10/2015 7:30 PM      Chief Complaint  Patient presents with  . Back Pain    The history is provided by the patient. No language interpreter was used.   Julia Roberts is a 30 y.o. female with history of chronic back pain who presents to the Emergency Department complaining of constant, 7/10 in severity, lower back pain with onset 2 weeks ago. She has had intermittent back pain since an epidural 6 years ago and the most recent episode started 2 weeks ago. Pt states that the pain is significantly today after she bent to pick up something at work. The pain radiates to the thighs bilaterally. Tylenol has provided insufficient relief in pain at home. Position changes and walking exacerbate the pain but she has been ambulatory. Pt denies incontinence of bowel or bladder, no saddle anesthesia. She has no PCP.   Past Medical History  Diagnosis Date  . Migraines   . Gonorrhea   . Chlamydia   . Anemia    Past Surgical History  Procedure Laterality Date  . Vaginal delivery  2010   No family history on file. Social History  Substance Use Topics  . Smoking status: Never Smoker   . Smokeless tobacco: None  . Alcohol Use: No   OB History    Gravida Para Term Preterm AB TAB SAB Ectopic Multiple Living   1 1 1       1      Review of Systems  Genitourinary:       No incontinence  Musculoskeletal: Positive for back pain.  Neurological:       Tingling at the bilateral lower extremities  All other systems reviewed and are negative.  Allergies  Review of patient's allergies indicates no known allergies.  Home Medications   Prior to Admission medications   Medication Sig Start Date End Date Taking? Authorizing Provider   Aspirin-Acetaminophen-Caffeine (EXCEDRIN PO) Take 2 tablets by mouth every 8 (eight) hours as needed (headache).    Historical Provider, MD  ferrous sulfate 325 (65 FE) MG tablet Take 325 mg by mouth daily with breakfast.    Historical Provider, MD  fluconazole (DIFLUCAN) 150 MG tablet Take this tablet PO on 12/09/2013 Patient not taking: Reported on 01/15/2015 12/06/13   Ashley Murrain, NP  ondansetron (ZOFRAN ODT) 4 MG disintegrating tablet Take 1 tablet (4 mg total) by mouth every 8 (eight) hours as needed for nausea or vomiting. 01/15/15   Fransico Meadow, PA-C   BP 115/58 mmHg  Pulse 98  Temp(Src) 97.3 F (36.3 C) (Oral)  Resp 18  Ht 5\' 7"  (1.702 m)  Wt 250 lb (113.399 kg)  BMI 39.15 kg/m2  SpO2 100%  LMP 12/03/2015 Physical Exam  Constitutional: She is oriented to person, place, and time. She appears well-developed and well-nourished. No distress.  HENT:  Head: Normocephalic and atraumatic.  Eyes: Conjunctivae are normal. Right eye exhibits no discharge. Left eye exhibits no discharge. No scleral icterus.  Cardiovascular: Normal rate.   Pulmonary/Chest: Effort normal.  Musculoskeletal:  TTP of left lumbar paraspinal muscle. No midline spinal tenderness. FROM of C, T, L spine. No step offs or obvious bony deformities.   Neurological: She is alert and oriented to  person, place, and time. Coordination normal.  Strength 5/5 throughout. No sensory deficits.  No gait abnormality.  Skin: Skin is warm and dry. No rash noted. She is not diaphoretic. No erythema. No pallor.  Psychiatric: She has a normal mood and affect. Her behavior is normal.  Nursing note and vitals reviewed.   ED Course  Procedures (including critical care time) DIAGNOSTIC STUDIES: Oxygen Saturation is 100% on RA,  normal by my interpretation.    COORDINATION OF CARE: 7:25 PM Discussed treatment plan which includes pain management with pt at bedside and pt agreed to plan.  Labs Review Labs Reviewed - No data to  display  Imaging Review No results found.    EKG Interpretation None      MDM   Final diagnoses:  Lumbar strain, initial encounter    Patient with back pain.  No neurological deficits and normal neuro exam.  Patient can walk but states is painful.  No loss of bowel or bladder control.  No concern for cauda equina.  No fever, night sweats, weight loss, h/o cancer, IVDU.  RICE protocol and pain medicine indicated and discussed with patient.   I personally performed the services described in this documentation, which was scribed in my presence. The recorded information has been reviewed and is accurate.     Dondra Spry [place], PA-C 12/10/15 [place], MD 12/11/15 [phone]

## 2017-02-25 ENCOUNTER — Encounter: Payer: Self-pay | Admitting: Podiatry

## 2017-02-25 ENCOUNTER — Ambulatory Visit (INDEPENDENT_AMBULATORY_CARE_PROVIDER_SITE_OTHER): Payer: Medicaid Other | Admitting: Podiatry

## 2017-02-25 DIAGNOSIS — B351 Tinea unguium: Secondary | ICD-10-CM

## 2017-02-25 MED ORDER — TERBINAFINE HCL 250 MG PO TABS: 250.0000 mg | ORAL_TABLET | Freq: Every day | ORAL | 0 refills | Status: DC

## 2017-02-25 NOTE — Progress Notes (Signed)
   Subjective:    Patient ID: Julia Roberts, female    DOB: 1986/07/09, 31 y.o.   MRN: [id number]  HPI Chief Complaint  Patient presents with  . Nail Problem    Hallux right - injury toenail in high school, toenail never grew back right, now thick and brittle, keeps trimmed      Review of Systems  All other systems reviewed and are negative.      Objective:   Physical Exam        Assessment & Plan:

## 2017-02-27 NOTE — Progress Notes (Signed)
Subjective:    Patient ID: Julia Roberts, female   DOB: 31 y.o.   MRN: [id number]   HPI patient is developed chronic problems with the right hallux nail which she traumatized in high school and it's never grown back quite correctly    Review of Systems  All other systems reviewed and are negative.       Objective:  Physical Exam  Constitutional: She is oriented to person, place, and time.  Cardiovascular: Intact distal pulses.   Neurological: She is oriented to person, place, and time.  Skin: Skin is warm and dry.  Nursing note and vitals reviewed.  neurovascular status intact muscle strength adequate range of motion within normal limits with patient found to have yellow thick discoloration of the right hallux nail but no pain when palpated and history of injury     Assessment:    Injury process leading to damage to the right hallux nail with possible superficial opportunistic fungal infection     Plan:    Reviewed that this has to do with the damage to the nailbed and that that is more important than the actual fungus and I do not recommend aggressive treatment is nothing at this point we'll be successful most likely

## 2017-06-16 NOTE — Progress Notes (Deleted)
    Referring-Dr Maylon Peppers Reason for referral-Murmur  HPI: 31 year old female for evaluation of murmur at request of Dr. Maylon Peppers.  Current Outpatient Prescriptions  Medication Sig Dispense Refill  . ferrous sulfate 325 (65 FE) MG tablet Take 325 mg by mouth daily with breakfast.    . terbinafine (LAMISIL) 250 MG tablet Take 1 tablet (250 mg total) by mouth daily. 90 tablet 0   No current facility-administered medications for this visit.     No Known Allergies  Past Medical History:  Diagnosis Date  . Anemia   . Chlamydia   . ECZEMA, ATOPIC DERMATITIS 12/02/2006   Qualifier: Diagnosis of  By: Herma Ard    . Gonorrhea   . Migraines   . OBESITY, NOS 12/02/2006   Qualifier: Diagnosis of  By: Herma Ard      Past Surgical History:  Procedure Laterality Date  . VAGINAL DELIVERY  2010    Social History   Social History  . Marital status: Single    Spouse name: N/A  . Number of children: N/A  . Years of education: N/A   Occupational History  . Not on file.   Social History Main Topics  . Smoking status: Never Smoker  . Smokeless tobacco: Never Used  . Alcohol use No  . Drug use: No  . Sexual activity: Yes    Birth control/ protection: None   Other Topics Concern  . Not on file   Social History Narrative  . No narrative on file    No family history on file.  ROS: no fevers or chills, productive cough, hemoptysis, dysphasia, odynophagia, melena, hematochezia, dysuria, hematuria, rash, seizure activity, orthopnea, PND, pedal edema, claudication. Remaining systems are negative.  Physical Exam:   There were no vitals taken for this visit.  General:  Well developed/well nourished in NAD Skin warm/dry Patient not depressed No peripheral clubbing Back-normal HEENT-normal/normal eyelids Neck supple/normal carotid upstroke bilaterally; no bruits; no JVD; no thyromegaly chest - CTA/ normal expansion CV - RRR/normal S1 and S2; no murmurs, rubs  or gallops;  PMI nondisplaced Abdomen -NT/ND, no HSM, no mass, + bowel sounds, no bruit 2+ femoral pulses, no bruits Ext-no edema, chords, 2+ DP Neuro-grossly nonfocal  ECG - personally reviewed  A/P  1  Julia Ruths, MD

## 2017-06-28 ENCOUNTER — Encounter: Payer: Self-pay | Admitting: *Deleted

## 2017-06-28 ENCOUNTER — Ambulatory Visit: Payer: Medicaid Other | Admitting: Cardiology

## 2017-09-10 ENCOUNTER — Emergency Department (HOSPITAL_COMMUNITY)
Admission: EM | Admit: 2017-09-10 | Discharge: 2017-09-10 | Disposition: A | Discharge status: Home / Self Care | Payer: Medicaid Other | Attending: Emergency Medicine | Admitting: Emergency Medicine

## 2017-09-10 ENCOUNTER — Other Ambulatory Visit: Payer: Self-pay

## 2017-09-10 ENCOUNTER — Encounter (HOSPITAL_COMMUNITY): Payer: Self-pay

## 2017-09-10 ENCOUNTER — Emergency Department (HOSPITAL_COMMUNITY): Payer: Medicaid Other

## 2017-09-10 DIAGNOSIS — Z79899 Other long term (current) drug therapy: Secondary | ICD-10-CM | POA: Insufficient documentation

## 2017-09-10 DIAGNOSIS — R0789 Other chest pain: Secondary | ICD-10-CM | POA: Insufficient documentation

## 2017-09-10 DIAGNOSIS — D649 Anemia, unspecified: Secondary | ICD-10-CM | POA: Diagnosis not present

## 2017-09-10 DIAGNOSIS — R079 Chest pain, unspecified: Secondary | ICD-10-CM | POA: Diagnosis present

## 2017-09-10 LAB — I-STAT TROPONIN, ED: Troponin i, poc: 0 ng/mL (ref 0.00–0.08)

## 2017-09-10 LAB — CBC
HEMATOCRIT: 27.3 % — AB (ref 36.0–46.0)
HEMOGLOBIN: 7.2 g/dL — AB (ref 12.0–15.0)
MCH: 17.1 pg — ABNORMAL LOW (ref 26.0–34.0)
MCHC: 26.4 g/dL — AB (ref 30.0–36.0)
MCV: 64.8 fL — ABNORMAL LOW (ref 78.0–100.0)
Platelets: 254 10*3/uL (ref 150–400)
RBC: 4.21 MIL/uL (ref 3.87–5.11)
RDW: 19.2 % — ABNORMAL HIGH (ref 11.5–15.5)
WBC: 3.6 10*3/uL — AB (ref 4.0–10.5)

## 2017-09-10 LAB — BASIC METABOLIC PANEL
Anion gap: 7 (ref 5–15)
BUN: 6 mg/dL (ref 6–20)
CO2: 23 mmol/L (ref 22–32)
Calcium: 9.3 mg/dL (ref 8.9–10.3)
Chloride: 106 mmol/L (ref 101–111)
Creatinine, Ser: 0.71 mg/dL (ref 0.44–1.00)
GFR calc Af Amer: >60 mL/min (ref >60)
GLUCOSE: 113 mg/dL — AB (ref 65–99)
POTASSIUM: 4.1 mmol/L (ref 3.5–5.1)
Sodium: 136 mmol/L (ref 135–145)

## 2017-09-10 LAB — I-STAT BETA HCG BLOOD, ED (MC, WL, AP ONLY)

## 2017-09-10 MED ORDER — FERROUS SULFATE 325 (65 FE) MG PO TABS: 325.0000 mg | ORAL_TABLET | Freq: Two times a day (BID) | ORAL | 1 refills | Status: DC

## 2017-09-10 MED ORDER — ACETAMINOPHEN 325 MG PO TABS
650.0000 mg | ORAL_TABLET | Freq: Once | ORAL | Status: AC
  Administered 2017-09-10: 650 mg via ORAL
  Filled 2017-09-10: qty 2

## 2017-09-10 NOTE — ED Triage Notes (Signed)
Per Pt, Pt is coming from home with complaints of mid-center chest pain that started at 0300 with some SOB. Radiates in her right arm to the right shoulder. Denies any N/V.

## 2017-09-10 NOTE — ED Provider Notes (Signed)
[facility] EMERGENCY DEPARTMENT Provider Note   CSN: [id number] Arrival date & time: 09/10/17  1043     History   Chief Complaint Chief Complaint  Patient presents with  . Chest Pain    HPI Julia Roberts is a 31 y.o. female.  Patient with history of iron deficiency anemia, no history of blood transfusion, presents with complaint of chest pain that is in the right side of the chest radiating to her right shoulder starting after waking up this morning at approximately 3 AM.  Pain is been constant.  It is worse with movement of her arms and upper body.  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  She does not have any associated shortness of breath or diaphoresis with the chest pain.  Patient does not have a history of hypertension, high cholesterol, diabetes, smoking.  No strong family history of coronary artery disease or heart attack in first-degree relatives. Patient denies risk factors for pulmonary embolism including: unilateral leg swelling, history of DVT/PE/other blood clots, use of exogenous hormones, recent immobilizations, recent surgery, recent travel (>4hr segment), malignancy, hemoptysis.  No treatments prior to arrival.  Patient does report ongoing shortness of breath when she walks up stairs specifically 3 flights.  She reports having to stop and catch her breath when getting to the top.  This is not new.  She does not have recurrent chest pains with this.  She does have a pre-existing history of anemia for which she has been taking iron supplementation periodically.  She states that this can cause her stomach to hurt so she does not always take it.  Reports history of heavy menstrual periods.       Past Medical History:  Diagnosis Date  . Anemia   . Chlamydia   . ECZEMA, ATOPIC DERMATITIS 12/02/2006   Qualifier: Diagnosis of  By: Herma Ard    . Gonorrhea   . Migraines   . OBESITY, NOS  12/02/2006   Qualifier: Diagnosis of  By: Herma Ard      Patient Active Problem List   Diagnosis Date Noted  . OBESITY, NOS 12/02/2006  . ECZEMA, ATOPIC DERMATITIS 12/02/2006    Past Surgical History:  Procedure Laterality Date  . VAGINAL DELIVERY  2010    OB History    Gravida Para Term Preterm AB Living   1 1 1     1    SAB TAB Ectopic Multiple Live Births                   Home Medications    Prior to Admission medications   Medication Sig Start Date End Date Taking? Authorizing Provider  aspirin-acetaminophen-caffeine (EXCEDRIN MIGRAINE) [phone] MG tablet Take 1 tablet by mouth every 6 (six) hours as needed for headache.   Yes [provider]  ferrous sulfate 325 (65 FE) MG tablet Take 1 tablet (325 mg total) by mouth 2 (two) times daily with a meal. 09/10/17   Carlisle Cater, PA-C    Family History No family history on file.  Social History Social History   Tobacco Use  . Smoking status: Never Smoker  . Smokeless tobacco: Never Used  Substance Use Topics  . Alcohol use: No  . Drug use: No     Allergies   Patient has no known allergies.   Review of Systems Review of Systems  Constitutional: Negative for diaphoresis and fever.  Eyes: Negative for redness.  Respiratory: Negative for cough and shortness of breath (no acute SOB with CP).   Cardiovascular: Positive for chest pain. Negative for palpitations and leg swelling.  Gastrointestinal: Negative for abdominal pain, nausea and vomiting.  Genitourinary: Negative for dysuria.  Musculoskeletal: Negative for back pain and neck pain.  Skin: Negative for rash.  Neurological: Negative for syncope and light-headedness.  Psychiatric/Behavioral: The patient is not nervous/anxious.      Physical Exam Updated Vital Signs BP (!) 120/51 (BP Location: Left Arm)   Pulse 89   Temp 98.3 F (36.8 C) (Oral)   Resp (!) 22   Ht 5\' 7"  (1.702 m)   Wt 114.8 kg (253 lb)   LMP 08/11/2017   SpO2  98%   BMI 39.63 kg/m   Physical Exam  Constitutional: She appears well-developed and well-nourished.  HENT:  Head: Normocephalic and atraumatic.  Mouth/Throat: Mucous membranes are normal. Mucous membranes are not dry.  Eyes:  Conjunctival pallor noted.  Neck: Trachea normal and normal range of motion. Neck supple. Normal carotid pulses and no JVD present. No muscular tenderness present. Carotid bruit is not present. No tracheal deviation present.  Cardiovascular: Normal rate, regular rhythm, S1 normal, S2 normal, normal heart sounds and intact distal pulses. Exam reveals no decreased pulses.  No murmur heard. Patient with readily reproducible tenderness with palpation over the left and right mid sternal areas.  This reproduces the pain that she is describing.  Pulmonary/Chest: Effort normal. No respiratory distress. She has no wheezes. She exhibits no tenderness.  Abdominal: Soft. Normal aorta and bowel sounds are normal. There is no tenderness. There is no rebound and no guarding.  Musculoskeletal: Normal range of motion.       Right lower leg: She exhibits no tenderness and no edema.       Left lower leg: She exhibits no tenderness and no edema.  Neurological: She is alert.  Skin: Skin is warm and dry. She is not diaphoretic. No cyanosis. No pallor.  Psychiatric: She has a normal mood and affect.  Nursing note and vitals reviewed.    ED Treatments / Results  Labs (all labs ordered are listed, but only abnormal results are displayed) Labs Reviewed  BASIC METABOLIC PANEL - Abnormal; Notable for the following components:      Result Value   Glucose, Bld 113 (*)    All other components within normal limits  CBC - Abnormal; Notable for the following components:   WBC 3.6 (*)    Hemoglobin 7.2 (*)    HCT 27.3 (*)    MCV 64.8 (*)    MCH 17.1 (*)    MCHC 26.4 (*)    RDW 19.2 (*)    All other components within normal limits  I-STAT TROPONIN, ED  I-STAT BETA HCG BLOOD, ED ([facility],  [facility], [facility] ONLY)    EKG  EKG Interpretation None       Radiology Dg Chest 2 View  Result Date: 09/10/2017 CLINICAL DATA:  Chest pain with shortness of breath. EXAM: CHEST  2 VIEW COMPARISON:  None. FINDINGS: The cardiomediastinal silhouette is within normal limits. The lungs are well inflated and clear. There is no evidence of pleural effusion or pneumothorax. No acute osseous abnormality is identified. IMPRESSION: No active cardiopulmonary disease. Electronically Signed   By: Logan Bores M.D.   On: 09/10/2017 11:34    Procedures Procedures (including critical care time)  Medications Ordered in ED Medications  acetaminophen (TYLENOL) tablet 650 mg (not administered)  Initial Impression / Assessment and Plan / ED Course  I have reviewed the triage vital signs and the nursing notes.  Pertinent labs & imaging results that were available during my care of the patient were reviewed by me and considered in my medical decision making (see chart for details).     Patient seen and examined.  Workup reviewed.  EKG reviewed.  Vital signs reviewed and are as follows: BP (!) 120/51 (BP Location: Left Arm)   Pulse 89   Temp 98.3 F (36.8 C) (Oral)   Resp (!) 22   Ht 5\' 7"  (1.702 m)   Wt 114.8 kg (253 lb)   LMP 08/11/2017   SpO2 98%   BMI 39.63 kg/m   Discussed results with patient.  Regarding chest pain, will have patient rest and use Tylenol as needed.  Discussed importance of very close follow-up regarding her anemia.  Patient is close to the point of needing blood transfusion.  Her symptoms other than some exertional shortness of breath without chest pain which has been ongoing consistent with anemia.  Encourage patient to continue iron.  Encouraged patient to follow-up with [facility] for recheck of her hemoglobin and consideration of other treatments.  We discussed return to the emergency department with lightheadedness or syncope, persistent chest pain,  persistent shortness of breath.  Patient was counseled to return with severe chest pain, especially if the pain is crushing or pressure-like and spreads to the arms, back, neck, or jaw, or if they have sweating, nausea, or shortness of breath with the pain. They were encouraged to call 911 with these symptoms.   They were also told to return if their chest pain gets worse and does not go away with rest, they have an attack of chest pain lasting longer than usual despite rest and treatment with the medications their caregiver has prescribed, if they wake from sleep with chest pain or shortness of breath, if they feel dizzy or faint, if they have chest pain not typical of their usual pain, or if they have any other emergent concerns regarding their health.  The patient verbalized understanding and agreed.    Final Clinical Impressions(s) / ED Diagnoses   Final diagnoses:  Chest wall pain  Symptomatic anemia   Chest pain: Patient symptoms are most consistent with chest wall pain.  She has readily reproducible pain to palpation and pain is worse with movement.  She does do some strenuous activities at work.  Heart score equals 0.  Patient is PERC negative.  Patient has microcytic anemia likely due to heavy menstruation.  She does not take her iron supplementation routinely.  She has some chronic symptoms such as shortness of breath when she goes upstairs and fatigue, however this does not appear to be acutely decompensated.  Her hemoglobin is 7.2 today.  Do not feel that she requires admission to the hospital for blood transfusion and further testing.  Feel that she can follow-up with her PCP, referral given.  I do not feel that chest pain is related to the patient's anemia given history and exam as detailed above.  ED Discharge Orders        Ordered    ferrous sulfate 325 (65 FE) MG tablet  2 times daily with meals     09/10/17 1729       Carlisle Cater, PA-C 09/10/17 1742    Milton Ferguson, MD 09/10/17 2328

## 2017-09-10 NOTE — Discharge Instructions (Signed)
Please read and follow all provided instructions.  Your diagnoses today include:  1. Chest wall pain   2. Symptomatic anemia     Tests performed today include:  An EKG of your heart - shows some skipped beats  A chest x-ray - clear  Cardiac enzymes - a blood test for heart muscle damage is normal  Blood counts and electrolytes - shows very low blood counts (hemoglobin 7.2)  Vital signs. See below for your results today.   Medications prescribed:   Iron  Take any prescribed medications only as directed.  Follow-up instructions: Please follow-up with your primary care provider in 1 week for recheck of your hemoglobin.  Return instructions:  SEEK IMMEDIATE MEDICAL ATTENTION IF:  You have severe chest pain, especially if the pain is crushing or pressure-like and spreads to the arms, back, neck, or jaw, or if you have sweating, nausea (feeling sick to your stomach), or shortness of breath. THIS IS AN EMERGENCY. Don't wait to see if the pain will go away. Get medical help at once. Call 911 or 0 (operator). DO NOT drive yourself to the hospital.   Your chest pain gets worse and does not go away with rest.   You have an attack of chest pain lasting longer than usual, despite rest and treatment with the medications your caregiver has prescribed.   You wake from sleep with chest pain or shortness of breath.  You feel dizzy or faint.  You have chest pain not typical of your usual pain for which you originally saw your caregiver.   You have any other emergent concerns regarding your health.  Additional Information: Chest pain comes from many different causes. Your caregiver has diagnosed you as having chest pain that is not specific for one problem, but does not require admission.  You are at low risk for an acute heart condition or other serious illness.   Your vital signs today were: BP (!) 120/51 (BP Location: Left Arm)    Pulse 89    Temp 98.3 F (36.8 C) (Oral)    Resp (!)  22    Ht 5\' 7"  (1.702 m)    Wt 114.8 kg (253 lb)    LMP 08/11/2017    SpO2 98%    BMI 39.63 kg/m  If your blood pressure (BP) was elevated above 135/85 this visit, please have this repeated by your doctor within one month. --------------

## 2018-11-24 ENCOUNTER — Encounter: Payer: Self-pay | Admitting: Internal Medicine

## 2018-11-24 ENCOUNTER — Ambulatory Visit: Payer: Self-pay | Attending: Internal Medicine | Admitting: Internal Medicine

## 2018-11-24 VITALS — BP 111/63 | HR 82 | Temp 98.1°F | Resp 16 | Ht 67.0 in | Wt 263.8 lb

## 2018-11-24 DIAGNOSIS — R011 Cardiac murmur, unspecified: Secondary | ICD-10-CM

## 2018-11-24 DIAGNOSIS — Z6841 Body Mass Index (BMI) 40.0 and over, adult: Secondary | ICD-10-CM

## 2018-11-24 DIAGNOSIS — D509 Iron deficiency anemia, unspecified: Secondary | ICD-10-CM

## 2018-11-24 DIAGNOSIS — Z124 Encounter for screening for malignant neoplasm of cervix: Secondary | ICD-10-CM

## 2018-11-24 DIAGNOSIS — Z862 Personal history of diseases of the blood and blood-forming organs and certain disorders involving the immune mechanism: Secondary | ICD-10-CM | POA: Insufficient documentation

## 2018-11-24 MED ORDER — FERROUS SULFATE 300 (60 FE) MG/5ML PO SYRP: 300.0000 mg | ORAL_SOLUTION | Freq: Two times a day (BID) | ORAL | 6 refills | Status: DC

## 2018-11-24 NOTE — Patient Instructions (Signed)
Follow a Healthy Eating Plan - You can do it! Limit sugary drinks.  Avoid sodas, sweet tea, sport or energy drinks, or fruit drinks.  Drink water, lo-fat milk, or diet drinks. Limit snack foods.   Cut back on candy, cake, cookies, chips, ice cream.  These are a special treat, only in small amounts. Eat plenty of vegetables.  Especially dark green, red, and orange vegetables. Aim for at least 3 servings a day. More is better! Include fruit in your daily diet.  Whole fruit is much healthier than fruit juice! Limit "white" bread, "white" pasta, "white" rice.   Choose "100% whole grain" products, brown or wild rice. Avoid fatty meats. Try "Meatless Monday" and choose eggs or beans one day a week.  When eating meat, choose lean meats like chicken, [place], and fish.  Grill, broil, or bake meats instead of frying, and eat poultry without the skin. Eat less salt.  Avoid frozen pizzas, frozen dinners and salty foods.  Use seasonings other than salt in cooking.  This can help blood pressure and keep you from swelling Beer, wine and liquor have calories.  If you can safely drink alcohol, limit to 1 drink per day for women, 2 drinks for men  Try to exercise at least 150 minutes/week as discussed today.

## 2018-11-24 NOTE — Progress Notes (Addendum)
Patient ID: Julia Roberts, female    DOB: 08/31/1986  MRN: [id number]  CC: New Patient (Initial Visit)   Subjective: Julia Roberts is a 33 y.o. female who presents for new pt visit. Her concerns today include:  Pt with hx of IDA, migraines and obesity  No previous PCP  Pt wanting to have iron level checked. Hx of IDA x 7 yrs Takes iron OTC about 4 x a wk with supper.  Sometimes it makes her stomach hurts. Endorses fatigue, tiredness. Endorses dizziness occasionally when she stands up. Menses regular, last 4-5 days.  Heavy for first 2-3 days.  + mild cramps and "clots here and there." No blood in stools or black stools.   Last pap was over 3 yrs ago. No abnormal PAPs No vaginal dschg or itching Sexually active with 1 partner.  Uses condoms consistently.  Obesity:  Has cut back on portion sizes.  Does not eat bread, drinks a lot of water; drinks sodas occasionally.   "I was going to the gym."  She stopped when she was working and "had too much going on.  Family history, social history and surgical history reviewed.  Patient Active Problem List   Diagnosis Date Noted  . OBESITY, NOS 12/02/2006  . ECZEMA, ATOPIC DERMATITIS 12/02/2006     Current Outpatient Medications on File Prior to Visit  Medication Sig Dispense Refill  . aspirin-acetaminophen-caffeine (EXCEDRIN MIGRAINE) 250-250-65 MG tablet Take 1 tablet by mouth every 6 (six) hours as needed for headache.     No current facility-administered medications on file prior to visit.     No Known Allergies  Social History   Socioeconomic History  . Marital status: Single    Spouse name: Not on file  . Number of children: 1  . Years of education: some college  . Highest education level: Not on file  Occupational History  . Not on file  Social Needs  . Financial resource strain: Not on file  . Food insecurity:    Worry: Not on file    Inability: Not on file  . Transportation needs:    Medical: Not on file   Non-medical: Not on file  Tobacco Use  . Smoking status: Never Smoker  . Smokeless tobacco: Never Used  Substance and Sexual Activity  . Alcohol use: No  . Drug use: No  . Sexual activity: Yes    Birth control/protection: None  Lifestyle  . Physical activity:    Days per week: Not on file    Minutes per session: Not on file  . Stress: Not on file  Relationships  . Social connections:    Talks on phone: Not on file    Gets together: Not on file    Attends religious service: Not on file    Active member of club or organization: Not on file    Attends meetings of clubs or organizations: Not on file    Relationship status: Not on file  . Intimate partner violence:    Fear of current or ex partner: Not on file    Emotionally abused: Not on file    Physically abused: Not on file    Forced sexual activity: Not on file  Other Topics Concern  . Not on file  Social History Narrative  . Not on file    Family History  Problem Relation Age of Onset  . Migraines Mother   . Hypertension Father   . Diabetes Maternal Aunt  Past Surgical History:  Procedure Laterality Date  . VAGINAL DELIVERY  2010    ROS: Review of Systems  Constitutional: Negative for activity change.  Respiratory: Negative for chest tightness and shortness of breath.   Cardiovascular: Negative for chest pain, palpitations and leg swelling.       Reports being told in the past that she has a heart murmur.   Negative except as stated above  PHYSICAL EXAM: BP 111/63   Pulse 82   Temp 98.1 F (36.7 C) (Oral)   Resp 16   Ht 5\' 7"  (1.702 m)   Wt 263 lb 12.8 oz (119.7 kg)   SpO2 97%   BMI 41.32 kg/m   Wt Readings from Last 3 Encounters:  11/24/18 263 lb 12.8 oz (119.7 kg)  09/10/17 253 lb (114.8 kg)  12/10/15 250 lb (113.4 kg)   Physical Exam  General appearance - alert, well appearing, obese young African-American female and in no distress Mental status - normal mood, behavior, speech, dress,  motor activity, and thought processes Eyes -pale conjunctiva Mouth - mucous membranes moist, pharynx normal without lesions Neck - supple, no significant adenopathy Chest - clear to auscultation, no wheezes, rales or rhonchi, symmetric air entry Heart -1/6 soft systolic ejection murmur heard at the apex and left sternal border.  Regular rate rhythm, no gallops heard Breasts: No abnormal masses or nipple discharge appreciated.  No axillary lymphadenopathy Pelvic -CMA Pollock present: Normal external genitalia, vulva, vagina, cervix, and adnexa.  Uterus feels slightly enlarged but not irregular in shape Extremities - peripheral pulses normal, no pedal edema, no clubbing or cyanosis Skin -lower lip and skin appear pale   CMP Latest Ref Rng & Units 09/10/2017 01/15/2015  Glucose 65 - 99 mg/dL 113(H) 95  BUN 6 - 20 mg/dL 6 6  Creatinine 0.44 - 1.00 mg/dL 0.71 0.66  Sodium 135 - 145 mmol/L 136 135  Potassium 3.5 - 5.1 mmol/L 4.1 4.1  Chloride 101 - 111 mmol/L 106 104  CO2 22 - 32 mmol/L 23 23  Calcium 8.9 - 10.3 mg/dL 9.3 9.8  Total Protein 6.0 - 8.3 g/dL - 7.5  Total Bilirubin 0.3 - 1.2 mg/dL - 0.7  Alkaline Phos 39 - 117 U/L - 59  AST 0 - 37 U/L - 22  ALT 0 - 35 U/L - 20   Lipid Panel  No results found for: CHOL, TRIG, HDL, CHOLHDL, VLDL, LDLCALC, LDLDIRECT  CBC    Component Value Date/Time   WBC 3.6 (L) 09/10/2017 1101   RBC 4.21 09/10/2017 1101   HGB 7.2 (L) 09/10/2017 1101   HCT 27.3 (L) 09/10/2017 1101   PLT 254 09/10/2017 1101   MCV 64.8 (L) 09/10/2017 1101   MCH 17.1 (L) 09/10/2017 1101   MCHC 26.4 (L) 09/10/2017 1101   RDW 19.2 (H) 09/10/2017 1101   LYMPHSABS 1.3 01/15/2015 1150   MONOABS 0.5 01/15/2015 1150   EOSABS 0.1 01/15/2015 1150   BASOSABS 0.0 01/15/2015 1150    ASSESSMENT AND PLAN: 1. Microcytic anemia Likely due to menstrual cycles but will also check a hemoglobin electrophoresis We will try changing iron to liquid form to see if she tolerates it  better. - CBC - Iron, TIBC and Ferritin Panel - Hemoglobinopathy evaluation - ferrous sulfate 300 (60 Fe) MG/5ML syrup; Take 5 mLs (300 mg total) by mouth 2 (two) times daily with a meal.  Dispense: 300 mL; Refill: 6  2. Pap smear for cervical cancer screening - HIV Antibody (routine testing  w rflx) - RPR - Cytology - PAP  3. Class 3 severe obesity due to excess calories without serious comorbidity with body mass index (BMI) of 40.0 to 44.9 in adult [facility]) Healthy eating habits discussed.  Printed information given. Advised and encouraged her to get in at least 150 minutes of aerobic exercise per week - Comprehensive metabolic panel - Hemoglobin A1c  4. Flow murmur Can observe for now  Patient was given the opportunity to ask questions.  Patient verbalized understanding of the plan and was able to repeat key elements of the plan.   Addendum: Hemoglobin is 7 and iron studies revealed severe iron deficiency anemia.  Will have my CMA call patient and inform her that it is imperative that she take the iron supplement twice a day as prescribed.  We will have her return to the lab in 1 month for repeat CBC.  Will refer to hematology to be considered for iron transfusion. Orders Placed This Encounter  Procedures  . CBC  . Iron, TIBC and Ferritin Panel  . HIV Antibody (routine testing w rflx)  . RPR  . Hemoglobinopathy evaluation  . Comprehensive metabolic panel  . Hemoglobin A1c     Requested Prescriptions   Signed Prescriptions Disp Refills  . ferrous sulfate 300 (60 Fe) MG/5ML syrup 300 mL 6    Sig: Take 5 mLs (300 mg total) by mouth 2 (two) times daily with a meal.    Return in about 2 months (around 01/23/2019).  Karle Plumber, MD, FACP

## 2018-11-25 ENCOUNTER — Telehealth: Payer: Self-pay

## 2018-11-25 NOTE — Addendum Note (Signed)
Addended by: Karle Plumber B on: 11/25/2018 08:04 AM   Modules accepted: Orders

## 2018-11-25 NOTE — Telephone Encounter (Signed)
Contacted pt to go over lab results pt is aware and doesn't have any questions or concerns 

## 2018-11-28 LAB — CYTOLOGY - PAP
Chlamydia: NEGATIVE
Diagnosis: NEGATIVE
HPV: NOT DETECTED
Neisseria Gonorrhea: NEGATIVE
Trichomonas: NEGATIVE

## 2018-12-01 LAB — CBC
Hematocrit: 26.7 % — ABNORMAL LOW (ref 34.0–46.6)
Hemoglobin: 7 g/dL — CL (ref 11.1–15.9)
MCH: 17.8 pg — ABNORMAL LOW (ref 26.6–33.0)
MCHC: 26.2 g/dL — ABNORMAL LOW (ref 31.5–35.7)
MCV: 68 fL — ABNORMAL LOW (ref 79–97)
PLATELETS: 432 10*3/uL (ref 150–450)
RBC: 3.94 x10E6/uL (ref 3.77–5.28)
RDW: 21.9 % — AB (ref 11.7–15.4)
WBC: 4.6 10*3/uL (ref 3.4–10.8)

## 2018-12-01 LAB — IRON,TIBC AND FERRITIN PANEL
Ferritin: 4 ng/mL — ABNORMAL LOW (ref 15–150)
IRON: 14 ug/dL — AB (ref 27–159)
Iron Saturation: 3 % — CL (ref 15–55)
Total Iron Binding Capacity: 407 ug/dL (ref 250–450)
UIBC: 393 ug/dL (ref 131–425)

## 2018-12-01 LAB — COMPREHENSIVE METABOLIC PANEL
ALK PHOS: 62 IU/L (ref 39–117)
ALT: 18 IU/L (ref 0–32)
AST: 21 IU/L (ref 0–40)
Albumin/Globulin Ratio: 1.5 (ref 1.2–2.2)
Albumin: 4.3 g/dL (ref 3.8–4.8)
BUN/Creatinine Ratio: 12 (ref 9–23)
BUN: 9 mg/dL (ref 6–20)
Bilirubin Total: 0.5 mg/dL (ref 0.0–1.2)
CO2: 22 mmol/L (ref 20–29)
CREATININE: 0.75 mg/dL (ref 0.57–1.00)
Calcium: 9.6 mg/dL (ref 8.7–10.2)
Chloride: 101 mmol/L (ref 96–106)
GFR calc non Af Amer: 106 mL/min/{1.73_m2} (ref >59)
GFR, EST AFRICAN AMERICAN: 122 mL/min/{1.73_m2} (ref >59)
Globulin, Total: 2.9 g/dL (ref 1.5–4.5)
Glucose: 92 mg/dL (ref 65–99)
Potassium: 4.6 mmol/L (ref 3.5–5.2)
Sodium: 137 mmol/L (ref 134–144)
Total Protein: 7.2 g/dL (ref 6.0–8.5)

## 2018-12-01 LAB — HEMOGLOBINOPATHY EVALUATION
HEMOGLOBIN A2 QUANTITATION: 0.8 % — AB (ref 1.8–3.2)
HGB C: 0 %
HGB S: 0 %
HGB VARIANT: 0.9 % — ABNORMAL HIGH
Hemoglobin F Quantitation: 0 % (ref 0.0–2.0)
Hgb A: 98.3 % (ref 96.4–98.8)

## 2018-12-01 LAB — RPR: RPR Ser Ql: NONREACTIVE

## 2018-12-01 LAB — HIV ANTIBODY (ROUTINE TESTING W REFLEX): HIV Screen 4th Generation wRfx: NONREACTIVE

## 2018-12-01 LAB — HEMOGLOBIN A1C
Est. average glucose Bld gHb Est-mCnc: 91 mg/dL
HEMOGLOBIN A1C: 4.8 % (ref 4.8–5.6)

## 2018-12-03 ENCOUNTER — Telehealth: Payer: Self-pay | Admitting: Internal Medicine

## 2018-12-03 NOTE — Telephone Encounter (Signed)
Phone call placed to patient this morning.  I left message on her voicemail letting her know that in addition to iron deficiency anemia looks like she also may have a component of thalassemia anemia.  I told her to continue taking the iron supplement and that I have referred her to a hematologist for further evaluation.

## 2018-12-06 ENCOUNTER — Telehealth: Payer: Self-pay

## 2018-12-06 NOTE — Telephone Encounter (Signed)
Contacted pt to go over lab results pt didn't answer lvm asking pt to give me a call back at her earliest convenience  

## 2018-12-12 ENCOUNTER — Ambulatory Visit: Payer: Self-pay | Attending: Internal Medicine

## 2018-12-14 ENCOUNTER — Telehealth: Payer: Self-pay | Admitting: Internal Medicine

## 2018-12-14 NOTE — Telephone Encounter (Signed)
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 I wanted to let you know I turned the rest of my information today at the front desk  -Tiffany Kocher) Hello, Yes I received your missing paperwork, thanks for bringing it in. I spoke with the person who reviews my applications and there was just an issue with the last 90 days of complete checking and saving statements. If possible could you get a full statement because what you provided does not have a date, and its 90 days that are needed. You can email me pictures if coming in to the office is an inconvenience. You still have until 12/19/2018, however if you think it might take you longer to provide the Bank statements just let me know.  Thanks for everything,  Julia Roberts

## 2019-01-06 ENCOUNTER — Other Ambulatory Visit: Payer: Medicaid Other

## 2019-01-10 ENCOUNTER — Other Ambulatory Visit: Payer: Self-pay

## 2019-01-10 ENCOUNTER — Ambulatory Visit: Payer: Self-pay | Attending: Internal Medicine

## 2019-01-10 DIAGNOSIS — D509 Iron deficiency anemia, unspecified: Secondary | ICD-10-CM

## 2019-01-11 ENCOUNTER — Telehealth: Payer: Self-pay

## 2019-01-11 LAB — CBC
Hematocrit: 31.8 % — ABNORMAL LOW (ref 34.0–46.6)
Hemoglobin: 8.8 g/dL — ABNORMAL LOW (ref 11.1–15.9)
MCH: 20.1 pg — ABNORMAL LOW (ref 26.6–33.0)
MCHC: 27.7 g/dL — ABNORMAL LOW (ref 31.5–35.7)
MCV: 73 fL — ABNORMAL LOW (ref 79–97)
Platelets: 279 10*3/uL (ref 150–450)
RBC: 4.37 x10E6/uL (ref 3.77–5.28)
RDW: 17.9 % — ABNORMAL HIGH (ref 11.7–15.4)
WBC: 3.6 10*3/uL (ref 3.4–10.8)

## 2019-01-11 NOTE — Telephone Encounter (Signed)
Contacted pt to go over lab results pt is aware and doesn't have any questions or concerns 

## 2019-01-23 ENCOUNTER — Ambulatory Visit: Payer: Medicaid Other | Admitting: Internal Medicine

## 2019-02-08 ENCOUNTER — Encounter: Payer: Self-pay | Admitting: Internal Medicine

## 2019-02-08 ENCOUNTER — Other Ambulatory Visit: Payer: Self-pay

## 2019-02-08 ENCOUNTER — Ambulatory Visit: Payer: Self-pay | Attending: Internal Medicine | Admitting: Internal Medicine

## 2019-02-08 DIAGNOSIS — Z3009 Encounter for other general counseling and advice on contraception: Secondary | ICD-10-CM

## 2019-02-08 DIAGNOSIS — D509 Iron deficiency anemia, unspecified: Secondary | ICD-10-CM

## 2019-02-08 DIAGNOSIS — N920 Excessive and frequent menstruation with regular cycle: Secondary | ICD-10-CM | POA: Insufficient documentation

## 2019-02-08 DIAGNOSIS — Z6841 Body Mass Index (BMI) 40.0 and over, adult: Secondary | ICD-10-CM

## 2019-02-08 DIAGNOSIS — D500 Iron deficiency anemia secondary to blood loss (chronic): Secondary | ICD-10-CM

## 2019-02-08 MED ORDER — LEVONORGESTREL-ETHINYL ESTRAD 0.1-20 MG-MCG PO TABS: 1.0000 | ORAL_TABLET | Freq: Every day | ORAL | 6 refills | Status: DC

## 2019-02-08 NOTE — Progress Notes (Addendum)
Virtual Visit via Telephone Note Due to current restrictions/limitations of in-office visits due to the COVID-19 pandemic, this scheduled clinical appointment was converted to a telehealth visit  I connected with Julia Roberts on 02/08/19 at 9:24 a.m EDT by telephone  from my office and verified that I am speaking with the correct person using two identifiers.  Patient is at home.  Only the patient myself participated in this encounter.   I discussed the limitations, risks, security and privacy concerns of performing an evaluation and management service by telephone and the availability of in person appointments. I also discussed with the patient that there may be a patient responsible charge related to this service. The patient expressed understanding and agreed to proceed.   History of Present Illness: Iron deficiency anemia, obesity, flow murmur.  Patient last seen in February 2020.  Anemia: Iron studies seem consistent with iron deficiency anemia.  Hemoglobin electrophoresis revealed low hemoglobin A2 consistent with either an alpha thalassemia or iron deficiency.  I started her on iron and referred her to hematology.  She was unable to afford like the liq iron. Taking Iron 325 mg once a day.  Hemoglobin had increased from 7 to 8.8.  She did not take the hematology appointment after she was told that her hemoglobin had improved. -She reported heavy menstrual flow on last visit in which we did her Pap smear.  Uterus was slightly enlarged but not irregular in size.  Patient is requesting to be put on birth control to help decrease her flow.  She has never been on any form of birth control before.  She has not had any history of blood clots or liver issues.  She does not smoke.  Blood pressure was normal on last visit.  Obesity:  Has been walking daily in her neighborhood.  She has cut back on the amount that she eats, she has stopped eating fried foods.  Drinking more water. Last wgh was 264 lbs  a few days ago.  This is about the same weight that she was when she was seen back in February.   Observations/Objective: Lab Results  Component Value Date   WBC 3.6 01/10/2019   HGB 8.8 (L) 01/10/2019   HCT 31.8 (L) 01/10/2019   MCV 73 (L) 01/10/2019   PLT 279 01/10/2019   Lab Results  Component Value Date   IRON 14 (L) 11/24/2018   TIBC 407 11/24/2018   FERRITIN 4 (L) 11/24/2018   Hemoglobin electrophoresis revealed a low Hgb A2 which may indicate an alpha thalassemia or iron deficiency  Assessment and Plan: 1. Microcytic anemia Hgb A2 delta gene variant seen on hemoglobin electrophoresis and iron studies more consistent with iron deficiency anemia than alpha thalassemia.  Would like to get her to a hematologist for clarification.  In the meantime I instructed her to continue taking the iron supplement.  Return to the lab next week for repeat CBC. - CBC; Future - Ambulatory referral to Hematology - US Pelvic Complete With Transvaginal; Future  2. Menorrhagia with regular cycle Most likely because of iron deficiency anemia.  Will check pelvic ultrasound for fibroids. Discussed birth control methods that can help decrease menstrual flow including the Depo-Provera shot, Nexplanon implant, IUDs, and birth control pills/patch/ring.  Given that she is obese, Depo-Provera would not be a good option.  We do not offer the Nexplanon IUD here at our clinic.  She is agreeable to trying birth control pill.  I went over with her how to  take the pill.  Recommend that she starts on the first day of her next menstrual cycle.  Advised of the risks of blood clots in the legs and lungs.  If she develops any unexplained swelling in 1 leg or shortness of breath, patient told to follow-up for evaluation for blood clots - US Pelvic Complete With Transvaginal; Future  3. Class 3 severe obesity due to excess calories without serious comorbidity with body mass index (BMI) of 40.0 to 44.9 in adult  [facility]) Commended her on making some changes in her eating habits.  Further dietary counseling given.  Encouraged her to continue walking daily for at least 30 minutes.  4. Contraceptive education See #2 above   Follow Up Instructions: Follow-up in 3 months  I discussed the assessment and treatment plan with the patient. The patient was provided an opportunity to ask questions and all were answered. The patient agreed with the plan and demonstrated an understanding of the instructions.   The patient was advised to call back or seek an in-person evaluation if the symptoms worsen or if the condition fails to improve as anticipated.  I provided 19 minutes of non-face-to-face time during this encounter.  Addendum:  Anemia/Hb has improved.  Will cancel hematology referral.  Pt to continue iron supplement.  Karle Plumber, MD

## 2019-02-10 ENCOUNTER — Other Ambulatory Visit: Payer: Self-pay

## 2019-02-10 ENCOUNTER — Ambulatory Visit: Payer: Self-pay | Attending: Internal Medicine

## 2019-02-10 DIAGNOSIS — D509 Iron deficiency anemia, unspecified: Secondary | ICD-10-CM

## 2019-02-11 LAB — CBC
Hematocrit: 38.6 % (ref 34.0–46.6)
Hemoglobin: 11.6 g/dL (ref 11.1–15.9)
MCH: 22.6 pg — ABNORMAL LOW (ref 26.6–33.0)
MCHC: 30.1 g/dL — ABNORMAL LOW (ref 31.5–35.7)
MCV: 75 fL — ABNORMAL LOW (ref 79–97)
Platelets: 304 10*3/uL (ref 150–450)
RBC: 5.14 x10E6/uL (ref 3.77–5.28)
RDW: 16.8 % — ABNORMAL HIGH (ref 11.7–15.4)
WBC: 3.4 10*3/uL (ref 3.4–10.8)

## 2019-02-12 NOTE — Addendum Note (Signed)
Addended by: Karle Plumber B on: 02/12/2019 05:20 PM   Modules accepted: Orders

## 2019-02-13 ENCOUNTER — Telehealth: Payer: Self-pay

## 2019-02-13 NOTE — Telephone Encounter (Signed)
Contacted pt to go over lab results pt is aware and doesn't have any questions or concerns 

## 2019-02-16 ENCOUNTER — Telehealth: Payer: Self-pay | Admitting: Internal Medicine

## 2019-02-16 DIAGNOSIS — K0889 Other specified disorders of teeth and supporting structures: Secondary | ICD-10-CM

## 2019-02-16 NOTE — Telephone Encounter (Signed)
Patient called stating she is having pain and would like to know if she can have a referral to a dental office for her issue. Please follow up.

## 2019-02-16 NOTE — Telephone Encounter (Signed)
Will forward to pcp

## 2019-03-06 ENCOUNTER — Ambulatory Visit (HOSPITAL_COMMUNITY): Payer: Self-pay | Attending: Internal Medicine

## 2019-04-27 ENCOUNTER — Telehealth: Payer: Self-pay | Admitting: Internal Medicine

## 2019-04-27 NOTE — Telephone Encounter (Signed)
Will forward to covering provider.

## 2019-04-27 NOTE — Telephone Encounter (Signed)
Patient request if PCP can write a note for her job or a letter stating patient cannot operate an equipment 30 feet in the air due to last time she did it she passed out.  Pt. Stated her job stated she need a letter from her doctor so they can reposition her in another position at her job.

## 2019-04-28 NOTE — Telephone Encounter (Signed)
Will send to pcp ?

## 2019-04-28 NOTE — Telephone Encounter (Signed)
Will forward to pcp

## 2019-04-28 NOTE — Telephone Encounter (Signed)
This will need to wait for PCPs return on Monday.

## 2019-05-19 ENCOUNTER — Ambulatory Visit: Payer: Medicaid Other | Admitting: Family Medicine

## 2019-05-22 ENCOUNTER — Ambulatory Visit: Payer: Medicaid Other | Admitting: Family Medicine

## 2019-11-21 ENCOUNTER — Other Ambulatory Visit: Payer: Self-pay

## 2019-11-21 ENCOUNTER — Encounter: Payer: Self-pay | Admitting: Cardiovascular Disease

## 2019-11-21 ENCOUNTER — Telehealth: Payer: Self-pay | Admitting: Radiology

## 2019-11-21 ENCOUNTER — Ambulatory Visit: Payer: Medicaid Other | Admitting: Cardiovascular Disease

## 2019-11-21 VITALS — BP 124/68 | HR 77 | Ht 67.0 in | Wt 262.0 lb

## 2019-11-21 DIAGNOSIS — R011 Cardiac murmur, unspecified: Secondary | ICD-10-CM

## 2019-11-21 DIAGNOSIS — R002 Palpitations: Secondary | ICD-10-CM

## 2019-11-21 DIAGNOSIS — E785 Hyperlipidemia, unspecified: Secondary | ICD-10-CM

## 2019-11-21 NOTE — Telephone Encounter (Signed)
Enrolled patient for a 14 day Zio monitor to be mailed to patients home.  

## 2019-11-21 NOTE — Patient Instructions (Signed)
Medication Instructions:  Your physician recommends that you continue on your current medications as directed. Please refer to the Current Medication list given to you today.  If you need a refill on your cardiac medications before your next appointment, please call your pharmacy.   Lab work: Fasting Lipids and hepatic function If you have labs (blood work) drawn today and your tests are completely normal, you will receive your results only by: [place] (if you have MyChart) OR A paper copy in the mail If you have any lab test that is abnormal or we need to change your treatment, we will call you to review the results.  Testing/Procedures: Your physician has requested that you have an echocardiogram. Echocardiography is a painless test that uses sound waves to create images of your heart. It provides your doctor with information about the size and shape of your heart and how well your heart's chambers and valves are working. This procedure takes approximately one hour. There are no restrictions for this procedure. [place] [address]  AND  2 Week Zio Patch  Follow-Up: At [facility], you and your health needs are our priority.  As part of our continuing mission to provide you with exceptional heart care, we have created designated Provider Care Teams.  These Care Teams include your primary Cardiologist (physician) and Advanced Practice Providers (APPs -  Physician Assistants and Nurse Practitioners) who all work together to provide you with the care you need, when you need it. You may see Dr. Gwenlyn Found or one of the following Advanced Practice Providers on your designated Care Team:    Kerin Ransom, PA-C  [place], [place]  Coletta Memos, [place]  Your physician wants you to follow-up in: 6 weeks with Dr. Gwenlyn Found  Any Other Special Instructions Will Be Listed Below (If Applicable).  ZIO XT- Long Term Monitor Instructions   Your physician has requested you wear your  ZIO patch monitor 14 days.   This is a single patch monitor.  [employer] supplies one patch monitor per enrollment.  Additional stickers are not available.   Please do not apply patch if you will be having a Nuclear Stress Test, Echocardiogram, Cardiac CT, MRI, or Chest Xray during the time frame you would be wearing the monitor. The patch cannot be worn during these tests.  You cannot remove and re-apply the ZIO XT patch monitor.   Your ZIO patch monitor will be sent USPS Priority mail from [facility] directly to your home address. The monitor may also be mailed to a PO BOX if home delivery is not available.   It may take 3-5 days to receive your monitor after you have been enrolled.   Once you have received you monitor, please review enclosed instructions.  Your monitor has already been registered assigning a specific monitor serial # to you.   Applying the monitor   Shave hair from upper left chest.   Hold abrader disc by orange tab.  Rub abrader in 40 strokes over left upper chest as indicated in your monitor instructions.   Clean area with 4 enclosed alcohol pads .  Use all pads to assure are is cleaned thoroughly.  Let dry.   Apply patch as indicated in monitor instructions.  Patch will be place under collarbone on left side of chest with arrow pointing upward.   Rub patch adhesive wings for 2 minutes.Remove white label marked "1".  Remove white label marked "2".  Rub patch adhesive wings for 2  additional minutes.   While looking in a mirror, press and release button in center of patch.  A small green light will flash 3-4 times .  This will be your only indicator the monitor has been turned on.     Do not shower for the first 24 hours.  You may shower after the first 24 hours.   Press button if you feel a symptom. You will hear a small click.  Record Date, Time and Symptom in the Patient Log Book.   When you are ready to remove patch, follow instructions on last 2 pages of  Patient Log Book.  Stick patch monitor onto last page of Patient Log Book.   Place Patient Log Book in [place] box.  Use locking tab on box and tape box closed securely.  The [employer] has [employer] on it.  Please place in mailbox as soon as possible.  Your physician should have your test results approximately 7 days after the monitor has been mailed back to [facility].   Call [place] at [phone] if you have questions regarding your ZIO XT patch monitor.  Call them immediately if you see an orange light blinking on your monitor.   If your monitor falls off in less than 4 days contact our Monitor department at [phone].  If your monitor becomes loose or falls off after 4 days call [employer] at [phone] for suggestions on securing your monitor.

## 2019-11-21 NOTE — Assessment & Plan Note (Signed)
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  Will get a 2D echo to further evaluate

## 2019-11-21 NOTE — Progress Notes (Signed)
11/21/2019 Julia Roberts   10-01-1986  [id number]  Primary Physician Ladell Pier, MD Primary Cardiologist: Lorretta Harp MD Lupe Carney, [place]  HPI:  Julia Roberts is a 34 y.o. severely overweight single African-American female mother of one 58 year old daughter named Programme researcher, broadcasting/film/video .  She was referred by Dr. Wynetta Emery for evaluation of a cardiac murmur and palpitations.  She is currently in school at [employer].  She has no cardiac risk factors other than father who had a myocardial infarction in his late 42s.  She is never had a heart attack or stroke.  She denies chest pain or shortness of breath.  She has had a murmur for a long time and is referred here for evaluation of this.  She also complains of some palpitations.   Current Meds  Medication Sig  . aspirin-acetaminophen-caffeine (EXCEDRIN MIGRAINE) 250-250-65 MG tablet Take 1 tablet by mouth every 6 (six) hours as needed for headache.  . ferrous sulfate 325 (65 FE) MG tablet Take 325 mg by mouth daily with breakfast.  . levonorgestrel-ethinyl estradiol (ALESSE) 0.1-20 MG-MCG tablet Take 1 tablet by mouth daily.     No Known Allergies  Social History   Socioeconomic History  . Marital status: Single    Spouse name: Not on file  . Number of children: 1  . Years of education: some college  . Highest education level: Not on file  Occupational History  . Not on file  Tobacco Use  . Smoking status: Never Smoker  . Smokeless tobacco: Never Used  Substance and Sexual Activity  . Alcohol use: No  . Drug use: No  . Sexual activity: Yes    Birth control/protection: None  Other Topics Concern  . Not on file  Social History Narrative  . Not on file   Social Determinants of Health   Financial Resource Strain:   . Difficulty of Paying Living Expenses: Not on file  Food Insecurity:   . Worried About Charity fundraiser in the Last Year: Not on file  . Ran Out of Food in the  Last Year: Not on file  Transportation Needs:   . Lack of Transportation (Medical): Not on file  . Lack of Transportation (Non-Medical): Not on file  Physical Activity:   . Days of Exercise per Week: Not on file  . Minutes of Exercise per Session: Not on file  Stress:   . Feeling of Stress : Not on file  Social Connections:   . Frequency of Communication with Friends and Family: Not on file  . Frequency of Social Gatherings with Friends and Family: Not on file  . Attends Religious Services: Not on file  . Active Member of Clubs or Organizations: Not on file  . Attends Archivist Meetings: Not on file  . Marital Status: Not on file  Intimate Partner Violence:   . Fear of Current or Ex-Partner: Not on file  . Emotionally Abused: Not on file  . Physically Abused: Not on file  . Sexually Abused: Not on file     Review of Systems: General: negative for chills, fever, night sweats or weight changes.  Cardiovascular: negative for chest pain, dyspnea on exertion, edema, orthopnea, palpitations, paroxysmal nocturnal dyspnea or shortness of breath Dermatological: negative for rash Respiratory: negative for cough or wheezing Urologic: negative for hematuria Abdominal: negative for nausea, vomiting, diarrhea, bright red blood per rectum, melena, or hematemesis Neurologic: negative for visual changes,  syncope, or dizziness All other systems reviewed and are otherwise negative except as noted above.    Blood pressure 124/68, pulse 77, height 5\' 7"  (1.702 m), weight 262 lb (118.8 kg).  General appearance: alert and no distress Neck: no adenopathy, no carotid bruit, no JVD, supple, symmetrical, trachea midline and thyroid not enlarged, symmetric, no tenderness/mass/nodules Lungs: clear to auscultation bilaterally Heart: 2/6 outflow tract murmur Extremities: extremities normal, atraumatic, no cyanosis or edema Pulses: 2+ and symmetric Skin: Skin color, texture, turgor normal. No  rashes or lesions Neurologic: Alert and oriented X 3, normal strength and tone. Normal symmetric reflexes. Normal coordination and gait  EKG sinus rhythm at 77 with occasional PVCs.  I personally reviewed this EKG.  ASSESSMENT AND PLAN:   Palpitations Complaints of palpitations with PVCs on her twelve-lead EKG.  She does not drink a lot of caffeine.  These are somewhat annoying to her.  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  Will get a 2D echo to further evaluate      Lorretta Harp MD [facility], [facility] 11/21/2019 1:53 PM

## 2019-11-21 NOTE — Assessment & Plan Note (Signed)
Complaints of palpitations with PVCs on her twelve-lead EKG.  She does not drink a lot of caffeine.  These are somewhat annoying to her.  I am going get a 2-week Zio patch to further evaluate

## 2019-11-25 ENCOUNTER — Other Ambulatory Visit (INDEPENDENT_AMBULATORY_CARE_PROVIDER_SITE_OTHER): Payer: Self-pay

## 2019-11-25 DIAGNOSIS — R002 Palpitations: Secondary | ICD-10-CM

## 2019-11-27 ENCOUNTER — Other Ambulatory Visit: Payer: Self-pay

## 2019-11-27 ENCOUNTER — Encounter (HOSPITAL_COMMUNITY): Payer: Self-pay | Admitting: Emergency Medicine

## 2019-11-27 ENCOUNTER — Emergency Department (HOSPITAL_COMMUNITY): Payer: Medicaid Other

## 2019-11-27 ENCOUNTER — Emergency Department (HOSPITAL_COMMUNITY)
Admission: EM | Admit: 2019-11-27 | Discharge: 2019-11-27 | Disposition: A | Discharge status: Home / Self Care | Payer: Medicaid Other | Attending: Emergency Medicine | Admitting: Emergency Medicine

## 2019-11-27 DIAGNOSIS — R072 Precordial pain: Secondary | ICD-10-CM | POA: Diagnosis not present

## 2019-11-27 DIAGNOSIS — Z79899 Other long term (current) drug therapy: Secondary | ICD-10-CM | POA: Diagnosis not present

## 2019-11-27 DIAGNOSIS — R079 Chest pain, unspecified: Secondary | ICD-10-CM | POA: Diagnosis present

## 2019-11-27 LAB — BASIC METABOLIC PANEL
Anion gap: 7 (ref 5–15)
BUN: 9 mg/dL (ref 6–20)
CO2: 23 mmol/L (ref 22–32)
Calcium: 9.5 mg/dL (ref 8.9–10.3)
Chloride: 108 mmol/L (ref 98–111)
Creatinine, Ser: 0.7 mg/dL (ref 0.44–1.00)
GFR calc Af Amer: >60 mL/min (ref >60)
GFR calc non Af Amer: >60 mL/min (ref >60)
Glucose, Bld: 98 mg/dL (ref 70–99)
Potassium: 3.7 mmol/L (ref 3.5–5.1)
Sodium: 138 mmol/L (ref 135–145)

## 2019-11-27 LAB — CBC
HCT: 33.9 % — ABNORMAL LOW (ref 36.0–46.0)
Hemoglobin: 9.1 g/dL — ABNORMAL LOW (ref 12.0–15.0)
MCH: 19.5 pg — ABNORMAL LOW (ref 26.0–34.0)
MCHC: 26.8 g/dL — ABNORMAL LOW (ref 30.0–36.0)
MCV: 72.7 fL — ABNORMAL LOW (ref 80.0–100.0)
Platelets: 234 10*3/uL (ref 150–400)
RBC: 4.66 MIL/uL (ref 3.87–5.11)
RDW: 18.9 % — ABNORMAL HIGH (ref 11.5–15.5)
WBC: 4.2 10*3/uL (ref 4.0–10.5)
nRBC: 0 % (ref 0.0–0.2)

## 2019-11-27 LAB — TROPONIN I (HIGH SENSITIVITY)
Troponin I (High Sensitivity): <2 ng/L (ref <18)
Troponin I (High Sensitivity): <2 ng/L (ref <18)

## 2019-11-27 LAB — I-STAT BETA HCG BLOOD, ED (MC, WL, AP ONLY): I-stat hCG, quantitative: <5 m[IU]/mL (ref <5)

## 2019-11-27 MED ORDER — SODIUM CHLORIDE 0.9% FLUSH: 3.0000 mL | Freq: Once | INTRAVENOUS | Status: DC

## 2019-11-27 NOTE — ED Triage Notes (Signed)
Pt c/o central, non-radiating chest pain x 2 days. Denies shortness of breath. Hx anemia and heart murmur.

## 2019-11-27 NOTE — Discharge Instructions (Signed)
Please read and follow all provided instructions.  Your diagnoses today include:  1. Precordial pain     Tests performed today include:  An EKG of your heart -not changed from previous  A chest x-ray   Cardiac enzymes - a blood test for heart muscle damage, are normal  Blood counts and electrolytes  Vital signs. See below for your results today.   Medications prescribed:   None  Take any prescribed medications only as directed.  Follow-up instructions: Please follow-up with the ultrasound of your heart as scheduled.  Please wear the Zio patch.  Return instructions:  SEEK IMMEDIATE MEDICAL ATTENTION IF:  You have severe chest pain, especially if the pain is crushing or pressure-like and spreads to the arms, back, neck, or jaw, or if you have sweating, nausea (feeling sick to your stomach), or shortness of breath. THIS IS AN EMERGENCY. Don't wait to see if the pain will go away. Get medical help at once. Call 911 or 0 (operator). DO NOT drive yourself to the hospital.   Your chest pain gets worse and does not go away with rest.   You have an attack of chest pain lasting longer than usual, despite rest and treatment with the medications your caregiver has prescribed.   You wake from sleep with chest pain or shortness of breath.  You feel dizzy or faint.  You have chest pain not typical of your usual pain for which you originally saw your caregiver.   You have any other emergent concerns regarding your health.  Additional Information: Chest pain comes from many different causes. Your caregiver has diagnosed you as having chest pain that is not specific for one problem, but does not require admission.  You are at low risk for an acute heart condition or other serious illness.   Your vital signs today were: BP (!) 119/57   Pulse 85   Temp 98.4 F (36.9 C) (Oral)   Resp 16   LMP 10/30/2019   SpO2 100%  If your blood pressure (BP) was elevated above 135/85 this visit,  please have this repeated by your doctor within one month. --------------

## 2019-11-27 NOTE — ED Provider Notes (Signed)
[facility] EMERGENCY DEPARTMENT Provider Note   CSN: [id number] Arrival date & time: 11/27/19  1526     History Chief Complaint  Patient presents with  . Chest Pain    Julia Roberts is a 34 y.o. female.  Patient with no past cardiac history presents the emergency department with complaint of chest pain.  Patient has been having intermittent palpitations over the past several weeks.  She was actually seen by cardiology on 2/17.  Patient reports having dizzy spells twice after using treadmill.  No associated chest pain.  Patient states that she has not exercised since these episodes.  Zio patch was ordered as well as a cardiac echo due to heart murmur.  This is due to be performed in about a week.  Patient states that this morning around 6 AM she woke up with chest pain that went across her chest and into her bilateral shoulders.  She denies any associated nausea, vomiting, diaphoresis.  Pain occurred at rest.  Patient with a family history, father with question of MI in his 57s.  Patient denies a history of hypertension, high cholesterol, diabetes and does not smoke.  Patient denies risk factors for pulmonary embolism including: unilateral leg swelling, history of DVT/PE/other blood clots, recent immobilizations, recent surgery, recent travel (>4hr segment), malignancy, hemoptysis. OCPs on med list.          Past Medical History:  Diagnosis Date  . Anemia   . Chlamydia   . ECZEMA, ATOPIC DERMATITIS 12/02/2006   Qualifier: Diagnosis of  By: Herma Ard    . Gonorrhea   . Migraines   . OBESITY, NOS 12/02/2006   Qualifier: Diagnosis of  By: Herma Ard      Patient Active Problem List   Diagnosis Date Noted  . Palpitations 11/21/2019  . Menorrhagia with regular cycle 02/08/2019  . Microcytic anemia 11/24/2018  . Cardiac murmur 11/24/2018  . OBESITY, NOS 12/02/2006  . ECZEMA, ATOPIC DERMATITIS 12/02/2006    Past Surgical History:  Procedure  Laterality Date  . VAGINAL DELIVERY  2010     OB History    Gravida  1   Para  1   Term  1   Preterm      AB      Living  1     SAB      TAB      Ectopic      Multiple      Live Births              Family History  Problem Relation Age of Onset  . Migraines Mother   . Hypertension Father   . Diabetes Maternal Aunt     Social History   Tobacco Use  . Smoking status: Never Smoker  . Smokeless tobacco: Never Used  Substance Use Topics  . Alcohol use: No  . Drug use: No    Home Medications Prior to Admission medications   Medication Sig Start Date End Date Taking? Authorizing Provider  aspirin-acetaminophen-caffeine (EXCEDRIN MIGRAINE) [phone] MG tablet Take 1 tablet by mouth every 6 (six) hours as needed for headache.    [provider]  ferrous sulfate 325 (65 FE) MG tablet Take 325 mg by mouth daily with breakfast.    [provider]  levonorgestrel-ethinyl estradiol (ALESSE) 0.1-20 MG-MCG tablet Take 1 tablet by mouth daily. 02/08/19   Ladell Pier, MD    Allergies    Patient has no known allergies.  Review of  Systems   Review of Systems  Constitutional: Negative for diaphoresis and fever.  Eyes: Negative for redness.  Respiratory: Negative for cough and shortness of breath.   Cardiovascular: Positive for chest pain. Negative for palpitations and leg swelling.  Gastrointestinal: Negative for abdominal pain, nausea and vomiting.  Genitourinary: Negative for dysuria.  Musculoskeletal: Negative for back pain and neck pain.  Skin: Negative for rash.  Neurological: Negative for syncope and light-headedness.  Psychiatric/Behavioral: The patient is not nervous/anxious.     Physical Exam Updated Vital Signs BP (!) 119/57   Pulse 85   Temp 98.4 F (36.9 C) (Oral)   Resp 16   LMP 10/30/2019   SpO2 100%   Physical Exam Vitals and nursing note reviewed.  Constitutional:      Appearance: She is well-developed.  HENT:      Head: Normocephalic and atraumatic.  Eyes:     General:        Right eye: No discharge.        Left eye: No discharge.     Conjunctiva/sclera: Conjunctivae normal.  Cardiovascular:     Rate and Rhythm: Normal rate and regular rhythm.     Heart sounds: Murmur present. Systolic murmur present with a grade of 2/6.  Pulmonary:     Effort: Pulmonary effort is normal.     Breath sounds: Normal breath sounds. No wheezing, rhonchi or rales.  Abdominal:     Palpations: Abdomen is soft.     Tenderness: There is no abdominal tenderness.  Musculoskeletal:     Cervical back: Normal range of motion and neck supple.  Skin:    General: Skin is warm and dry.  Neurological:     Mental Status: She is alert.     ED Results / Procedures / Treatments   Labs (all labs ordered are listed, but only abnormal results are displayed) Labs Reviewed  CBC - Abnormal; Notable for the following components:      Result Value   Hemoglobin 9.1 (*)    HCT 33.9 (*)    MCV 72.7 (*)    MCH 19.5 (*)    MCHC 26.8 (*)    RDW 18.9 (*)    All other components within normal limits  BASIC METABOLIC PANEL  I-STAT BETA HCG BLOOD, ED ([facility], [facility], [facility] ONLY)  TROPONIN I (HIGH SENSITIVITY)  TROPONIN I (HIGH SENSITIVITY)    EKG EKG Interpretation  Date/Time:  Monday November 27 2019 15:43:23 EST Ventricular Rate:  80 PR Interval:  178 QRS Duration: 82 QT Interval:  388 QTC Calculation: 447 R Axis:   87 Text Interpretation: Sinus rhythm with sinus arrhythmia with occasional Premature ventricular complexes Nonspecific T wave abnormality Abnormal ECG No significant change since 12/18 Confirmed by Aletta Edouard [id number]) on 11/27/2019 5:40:54 PM   Radiology DG Chest 2 View  Result Date: 11/27/2019 CLINICAL DATA:  Chest pain. EXAM: CHEST - 2 VIEW COMPARISON:  09/10/2017 FINDINGS: The heart size and mediastinal contours are within normal limits. Both lungs are clear. The visualized skeletal structures are unremarkable.  IMPRESSION: No acute cardiopulmonary findings. Electronically Signed   By: Marijo Sanes M.D.   On: 11/27/2019 16:08    Procedures Procedures (including critical care time)  Medications Ordered in ED Medications  sodium chloride flush (NS) 0.9 % injection 3 mL (has no administration in time range)    ED Course  I have reviewed the triage vital signs and the nursing notes.  Pertinent labs & imaging results that were available during  my care of the patient were reviewed by me and considered in my medical decision making (see chart for details).  Patient seen and examined. Work-up initiated.  Vital signs reviewed and are as follows: BP (!) 119/57   Pulse 85   Temp 98.4 F (36.9 C) (Oral)   Resp 16   LMP 10/30/2019   SpO2 100%   Initial work-up reviewed with patient at bedside.  Encouraged her to complete monitoring with Zio patch and follow-up for her echocardiogram as planned.  Given her chest pain today and recent dizzy spells with activity, will obtain second troponin which was due at time of exam.  Anticipate discharge to home with follow-up as above.  8:02 PM second marker negative.  Patient updated on results.  We will discharged home.  Again reiterated avoidance of exercise until echocardiogram is done given the symptoms that she had.  Patient was counseled to return with severe chest pain, especially if the pain is crushing or pressure-like and spreads to the arms, back, neck, or jaw, or if they have sweating, nausea, or shortness of breath with the pain. They were encouraged to call 911 with these symptoms.   The patient verbalized understanding and agreed.      MDM Rules/Calculators/A&P                      Patient with an episode of chest pain earlier today.  EKG nonischemic and unchanged.  2 troponins negative.  Chest x-ray is clear.  Labs are reassuring otherwise.  Patient has seen cardiology for palpitations and heart murmur.  She has an upcoming echocardiogram  and is currently using a Zio patch.  Patient has plans to follow-up.  She will continue to avoid caffeine, exercise for the time being.  I have low concern for PE at this time given on presents chest pain, no tachycardia or shortness of breath.  No lower extremity swelling or clinical signs and symptoms of DVT.  Anemia at baseline.  No active bleeding reported.  No indication for transfusion or further work-up at this time.  Final Clinical Impression(s) / ED Diagnoses Final diagnoses:  Precordial pain    Rx / DC Orders ED Discharge Orders    None       Suann Larry 11/27/19 2004    Hayden Rasmussen, MD 11/28/19 [phone]

## 2019-11-27 NOTE — ED Notes (Signed)
Patient verbalizes understanding of discharge instructions. Opportunity for questioning and answers were provided. Armband removed by staff, pt discharged from ED ambulatory to home.  

## 2019-11-30 LAB — LIPID PANEL
Chol/HDL Ratio: 2.3 ratio (ref 0.0–4.4)
Cholesterol, Total: 119 mg/dL (ref 100–199)
HDL: 51 mg/dL (ref >39)
LDL Chol Calc (NIH): 57 mg/dL (ref 0–99)
Triglycerides: 43 mg/dL (ref 0–149)
VLDL Cholesterol Cal: 11 mg/dL (ref 5–40)

## 2019-11-30 LAB — HEPATIC FUNCTION PANEL
ALT: 16 IU/L (ref 0–32)
AST: 18 IU/L (ref 0–40)
Albumin: 4.3 g/dL (ref 3.8–4.8)
Alkaline Phosphatase: 65 IU/L (ref 39–117)
Bilirubin Total: 0.4 mg/dL (ref 0.0–1.2)
Bilirubin, Direct: 0.14 mg/dL (ref 0.00–0.40)
Total Protein: 7 g/dL (ref 6.0–8.5)

## 2019-12-05 ENCOUNTER — Ambulatory Visit (HOSPITAL_COMMUNITY): Payer: Medicaid Other | Attending: Internal Medicine

## 2019-12-05 ENCOUNTER — Other Ambulatory Visit: Payer: Self-pay

## 2019-12-05 DIAGNOSIS — R011 Cardiac murmur, unspecified: Secondary | ICD-10-CM | POA: Diagnosis not present

## 2019-12-26 ENCOUNTER — Other Ambulatory Visit: Payer: Self-pay

## 2019-12-26 ENCOUNTER — Encounter: Payer: Self-pay | Admitting: Cardiovascular Disease

## 2019-12-26 ENCOUNTER — Telehealth: Payer: Self-pay | Admitting: *Deleted

## 2019-12-26 ENCOUNTER — Ambulatory Visit (INDEPENDENT_AMBULATORY_CARE_PROVIDER_SITE_OTHER): Payer: Self-pay | Admitting: Cardiovascular Disease

## 2019-12-26 DIAGNOSIS — R002 Palpitations: Secondary | ICD-10-CM

## 2019-12-26 DIAGNOSIS — G4733 Obstructive sleep apnea (adult) (pediatric): Secondary | ICD-10-CM | POA: Insufficient documentation

## 2019-12-26 DIAGNOSIS — R011 Cardiac murmur, unspecified: Secondary | ICD-10-CM

## 2019-12-26 NOTE — Assessment & Plan Note (Signed)
History of cardiac murmur with recent 2D echo that showed normal LV systolic function without significant valvular heart disease.

## 2019-12-26 NOTE — Assessment & Plan Note (Signed)
History of palpitations with recent 2-week Zio patch that showed frequent PVCs and Wenke block.  Since changing her type of water utilization, her chest pain and PVCs have markedly improved.

## 2019-12-26 NOTE — Progress Notes (Signed)
12/26/2019 Julia Roberts   [birth year]  [id number]  Primary Physician Ladell Pier, MD Primary Cardiologist: Lorretta Harp MD Lupe Carney, [place]  HPI:  Julia Roberts is a 34 y.o.  severely overweight single African-American female mother of one 23 year old daughter named Programme researcher, broadcasting/film/video .  She was referred by Dr. Wynetta Emery for evaluation of a cardiac murmur and palpitations.    I last saw her in the office 11/21/2019.  She is currently in school at [employer].  She has no cardiac risk factors other than father who had a myocardial infarction in his late 51s.  She is never had a heart attack or stroke.  She denies chest pain or shortness of breath.  She has had a murmur for a long time and is referred here for evaluation of this.  She also complains of some palpitations.  Since I saw her a little over a month ago she was seen in the emergency room several days after her office visit with me with atypical chest pain.  Her work-up was unrevealing.  Since changing her type of water that she drinks, her chest pain and palpitations have improved.  A 2-week Zio patch did show frequent PVCs with occasional Wenckebach and 2D echocardiogram was essentially normal.  She does complain of signs and symptoms consistent with obstructive sleep apnea with morbid obesity and a BMI of 43, nocturnal snoring and daytime somnolence.  Her sleep apnea may also be contributing to her PVCs.   Current Meds  Medication Sig  . aspirin-acetaminophen-caffeine (EXCEDRIN MIGRAINE) 250-250-65 MG tablet Take 1 tablet by mouth every 6 (six) hours as needed for headache.  . ferrous sulfate 325 (65 FE) MG tablet Take 325 mg by mouth daily with breakfast.     No Known Allergies  Social History   Socioeconomic History  . Marital status: Single    Spouse name: Not on file  . Number of children: 1  . Years of education: some college  . Highest education level: Not on file    Occupational History  . Not on file  Tobacco Use  . Smoking status: Never Smoker  . Smokeless tobacco: Never Used  Substance and Sexual Activity  . Alcohol use: No  . Drug use: No  . Sexual activity: Yes    Birth control/protection: None  Other Topics Concern  . Not on file  Social History Narrative  . Not on file   Social Determinants of Health   Financial Resource Strain:   . Difficulty of Paying Living Expenses:   Food Insecurity:   . Worried About Charity fundraiser in the Last Year:   . Arboriculturist in the Last Year:   Transportation Needs:   . Film/video editor (Medical):   Marland Kitchen Lack of Transportation (Non-Medical):   Physical Activity:   . Days of Exercise per Week:   . Minutes of Exercise per Session:   Stress:   . Feeling of Stress :   Social Connections:   . Frequency of Communication with Friends and Family:   . Frequency of Social Gatherings with Friends and Family:   . Attends Religious Services:   . Active Member of Clubs or Organizations:   . Attends Archivist Meetings:   Marland Kitchen Marital Status:   Intimate Partner Violence:   . Fear of Current or Ex-Partner:   . Emotionally Abused:   Marland Kitchen Physically Abused:   . Sexually Abused:  Review of Systems: General: negative for chills, fever, night sweats or weight changes.  Cardiovascular: negative for chest pain, dyspnea on exertion, edema, orthopnea, palpitations, paroxysmal nocturnal dyspnea or shortness of breath Dermatological: negative for rash Respiratory: negative for cough or wheezing Urologic: negative for hematuria Abdominal: negative for nausea, vomiting, diarrhea, bright red blood per rectum, melena, or hematemesis Neurologic: negative for visual changes, syncope, or dizziness All other systems reviewed and are otherwise negative except as noted above.    Blood pressure 118/82, pulse 65, height 5\' 7"  (1.702 m), weight 276 lb (125.2 kg), SpO2 99 %.  General appearance: alert  and no distress Neck: no adenopathy, no carotid bruit, no JVD, supple, symmetrical, trachea midline and thyroid not enlarged, symmetric, no tenderness/mass/nodules Lungs: clear to auscultation bilaterally Heart: regular rate and rhythm, S1, S2 normal, no murmur, click, rub or gallop Extremities: extremities normal, atraumatic, no cyanosis or edema Pulses: 2+ and symmetric Skin: Skin color, texture, turgor normal. No rashes or lesions Neurologic: Alert and oriented X 3, normal strength and tone. Normal symmetric reflexes. Normal coordination and gait  EKG not performed today  ASSESSMENT AND PLAN:   Obstructive sleep apnea This letter has morbid obesity with a BMI of 43, nighttime snoring and daytime somnolence with PVCs.  I think she has clinical obstructive sleep apnea and would benefit from outpatient sleep study.  I am going to set that up and have her see Dr. Ellouise Newer back in follow-up  Palpitations History of palpitations with recent 2-week Zio patch that showed frequent PVCs and Wenke block.  Since changing her type of water utilization, her chest pain and PVCs have markedly improved.  Cardiac murmur History of cardiac murmur with recent 2D echo that showed normal LV systolic function without significant valvular heart disease.      Lorretta Harp MD FACP,FACC,FAHA, [facility] 12/26/2019 8:38 AM

## 2019-12-26 NOTE — Assessment & Plan Note (Signed)
This letter has morbid obesity with a BMI of 43, nighttime snoring and daytime somnolence with PVCs.  I think she has clinical obstructive sleep apnea and would benefit from outpatient sleep study.  I am going to set that up and have her see Dr. Ellouise Newer back in follow-up

## 2019-12-26 NOTE — Patient Instructions (Signed)
Medication Instructions:  NO CHANGE *If you need a refill on your cardiac medications before your next appointment, please call your pharmacy*   Lab Work: If you have labs (blood work) drawn today and your tests are completely normal, you will receive your results only by: Marland Kitchen MyChart Message (if you have MyChart) OR . A paper copy in the mail If you have any lab test that is abnormal or we need to change your treatment, we will call you to review the results.   Testing/Procedures: Your physician has recommended that you have a sleep study. This test records several body functions during sleep, including: brain activity, eye movement, oxygen and carbon dioxide blood levels, heart rate and rhythm, breathing rate and rhythm, the flow of air through your mouth and nose, snoring, body muscle movements, and chest and belly movement.AT [place]: At [facility], you and your health needs are our priority.  As part of our continuing mission to provide you with exceptional heart care, we have created designated Provider Care Teams.  These Care Teams include your primary Cardiologist (physician) and Advanced Practice Providers (APPs -  Physician Assistants and Nurse Practitioners) who all work together to provide you with the care you need, when you need it.  We recommend signing up for the patient portal called "MyChart".  Sign up information is provided on this After Visit Summary.  MyChart is used to connect with patients for Virtual Visits (Telemedicine).  Patients are able to view lab/test results, encounter notes, upcoming appointments, etc.  Non-urgent messages can be sent to your provider as well.   To learn more about what you can do with MyChart, go to [email or web address].    Your next appointment:   12 month(s)  The format for your next appointment:   Either In Person or Virtual  Provider:   You may see Quay Burow MD or one of the following Advanced  Practice Providers on your designated Care Team:    Kerin Ransom, PA-C  [place], [place]  Coletta Memos, [place]

## 2019-12-26 NOTE — Telephone Encounter (Signed)
Left message to return a call to discuss sleep study appointment details. 

## 2020-01-01 ENCOUNTER — Other Ambulatory Visit: Payer: Self-pay

## 2020-01-01 ENCOUNTER — Ambulatory Visit (HOSPITAL_COMMUNITY): Payer: Medicaid Other | Admitting: Psychiatry

## 2020-01-12 ENCOUNTER — Other Ambulatory Visit (HOSPITAL_COMMUNITY)
Admission: RE | Admit: 2020-01-12 | Discharge: 2020-01-12 | Disposition: A | Discharge status: Home / Self Care | Payer: Medicaid Other | Source: Ambulatory Visit | Attending: Cardiovascular Disease | Admitting: Cardiovascular Disease

## 2020-01-12 DIAGNOSIS — Z20822 Contact with and (suspected) exposure to covid-19: Secondary | ICD-10-CM | POA: Insufficient documentation

## 2020-01-12 DIAGNOSIS — Z01812 Encounter for preprocedural laboratory examination: Secondary | ICD-10-CM | POA: Insufficient documentation

## 2020-01-12 LAB — SARS CORONAVIRUS 2 (TAT 6-24 HRS): SARS Coronavirus 2: NEGATIVE

## 2020-01-14 ENCOUNTER — Other Ambulatory Visit: Payer: Self-pay

## 2020-01-14 ENCOUNTER — Ambulatory Visit (HOSPITAL_BASED_OUTPATIENT_CLINIC_OR_DEPARTMENT_OTHER): Payer: Medicaid Other | Attending: Cardiovascular Disease | Admitting: Cardiovascular Disease

## 2020-01-14 DIAGNOSIS — G4733 Obstructive sleep apnea (adult) (pediatric): Secondary | ICD-10-CM | POA: Insufficient documentation

## 2020-01-15 ENCOUNTER — Other Ambulatory Visit (HOSPITAL_BASED_OUTPATIENT_CLINIC_OR_DEPARTMENT_OTHER): Payer: Self-pay

## 2020-01-15 DIAGNOSIS — G4733 Obstructive sleep apnea (adult) (pediatric): Secondary | ICD-10-CM

## 2020-01-28 ENCOUNTER — Encounter (HOSPITAL_BASED_OUTPATIENT_CLINIC_OR_DEPARTMENT_OTHER): Payer: Self-pay | Admitting: Cardiovascular Disease

## 2020-01-28 NOTE — Procedures (Signed)
Patient Name: Julia Roberts, Julia Roberts Study Date: 01/14/2020 Gender: Female D.O.B: 1986/04/09 Age (years): 33 Referring Provider: Lorretta Harp Height (inches): 55 Interpreting Physician: Shelva Majestic MD, [facility] Weight (lbs): 269 RPSGT: Gwenyth Allegra BMI: 42 MRN: [id number] Neck Size: 15.00  CLINICAL INFORMATION Sleep Study Type: NPSG  Indication for sleep study: N/A  Epworth Sleepiness Score: 6  SLEEP STUDY TECHNIQUE As per the AASM Manual for the Scoring of Sleep and Associated Events v2.3 (April 2016) with a hypopnea requiring 4% desaturations.  The channels recorded and monitored were frontal, central and occipital EEG, electrooculogram (EOG), submentalis EMG (chin), nasal and oral airflow, thoracic and abdominal wall motion, anterior tibialis EMG, snore microphone, electrocardiogram, and pulse oximetry.  MEDICATIONS  aspirin-acetaminophen-caffeine (EXCEDRIN MIGRAINE) 250-250-65 MG tablet ferrous sulfate 325 (65 FE) MG tablet  Medications self-administered by patient taken the night of the study : N/A  SLEEP ARCHITECTURE The study was initiated at 9:34:42 PM and ended at 3:58:38 AM.  Sleep onset time was 82.8 minutes and the sleep efficiency was 58.4%%. The total sleep time was 224.1 minutes.  Stage REM latency was 234.5 minutes.  The patient spent 7.6%% of the night in stage N1 sleep, 77.9%% in stage N2 sleep, 0.0%% in stage N3 and 14.5% in REM.  Alpha intrusion was absent.  Supine sleep was 47.35%.  RESPIRATORY PARAMETERS The overall apnea/hypopnea index (AHI) was 6.4 per hour.  The respiratory disturbance index (RDI) was 7.2/h. There were 0 total apneas, including 0 obstructive, 0 central and 0 mixed apneas. There were 24 hypopneas and 3 RERAs.  The AHI during Stage REM sleep was 16.6 per hour.  AHI while supine was 13.6 per hour.  The mean oxygen saturation was 95.7%. The minimum SpO2 during sleep was 84.0%.  Soft snoring was noted during this  study.  CARDIAC DATA The 2 lead EKG demonstrated sinus rhythm. The mean heart rate was 73.4 beats per minute. Other EKG findings include: PVCs.  LEG MOVEMENT DATA The total PLMS were 0 with a resulting PLMS index of 0.0. Associated arousal with leg movement index was 0.0 .  IMPRESSIONS - Mild obstructive sleep apnea overall (AHI 6.4/h; RDI 7.2/h); however, sleep apnea was moderate during REM sleep (AHI 16.6/h). - No significant central sleep apnea occurred during this study (CAI = 0.0/h). - Moderate oxygen desaturation to a nadir of 84%. - The patient snored with soft snoring volume. - Reduced sleep efficiency at 58.4% with wake after sleep onset (WASO) at 77 mionutes. - Abnormal sleep architecture with absence of slow wave sleep and prolonged latency to REM sleep. - EKG findings include occasional PVCs. - Clinically significant periodic limb movements did not occur during sleep. No significant associated arousals.  DIAGNOSIS - Obstructive Sleep Apnea (327.23 [G47.33 ICD-10]) - Nocturnal Hypoxemia (327.26 [G47.36 ICD-10])  RECOMMENDATIONS - In this patient with morbid obesity and cardiovascular comorbidities recommend a CPAP titration study for optimal treatment of her sleep disordered breathing. - Effort should be made to optimize nasal and oropharyngeal patency. - Positional therapy avoiding supine position during sleep. - Avoid alcohol, sedatives and other CNS depressants that may worsen sleep apnea and disrupt normal sleep architecture. - Sleep hygiene should be reviewed to assess factors that may improve sleep quality. - Weight management (BMI 42) and regular exercise should be initiated if appropriate.  [Electronically signed] 01/28/2020 05:52 PM  Shelva Majestic MD, [facility], [place], American Board of Sleep Medicine   NPI: [id number] [place]: [phone]  FX: [phone] [place]

## 2020-03-11 ENCOUNTER — Telehealth: Payer: Self-pay | Admitting: *Deleted

## 2020-03-11 DIAGNOSIS — G4733 Obstructive sleep apnea (adult) (pediatric): Secondary | ICD-10-CM

## 2020-03-11 NOTE — Telephone Encounter (Signed)
-----   Message from Troy Sine, MD sent at 01/28/2020  5:55 PM EDT ----- Mariann Laster, please notify pt and schedule for CPAP titration study.

## 2020-03-11 NOTE — Telephone Encounter (Addendum)
Called results lmtcb on cell.  Returned call: Informed patient of sleep study results and patient understanding was verbalized. Patient understands her sleep study showed Mild obstructive sleep apnea overall (AHI 6.4/h; RDI 7.2/h); however, sleep apnea was moderate during REM sleep (AHI 16.6/h). -DIAGNOSIS - Obstructive Sleep Apnea (327.23 [G47.33 ICD-10]) - Nocturnal Hypoxemia (327.26 [G47.36 ICD-10])  RECOMMENDATIONS  recommend a CPAP titration   Titration sent to sleep pool

## 2020-03-13 ENCOUNTER — Telehealth: Payer: Self-pay | Admitting: *Deleted

## 2020-03-13 NOTE — Telephone Encounter (Signed)
Staff message sent to Gae Bon ok to schedule CPAP titration. Patient has Medicaid. No PA is required.

## 2020-03-14 NOTE — Addendum Note (Signed)
Addended by: Freada Bergeron on: 03/14/2020 01:30 PM   Modules accepted: Orders

## 2020-03-14 NOTE — Telephone Encounter (Signed)
Patient is scheduled for CPAP Titration on 03/27/20. Pt is scheduled for COVID screening on 03/25/20 2:30 prior to titration.  Patient understands her titration study will be done at [facility] sleep lab. Patient understands she will receive a letter in a week or so detailing appointment, date, time, and location. Patient understands to call if she does not receive the letter  in a timely manner. Patient agrees with treatment and thanked me for call.

## 2020-03-25 ENCOUNTER — Other Ambulatory Visit (HOSPITAL_COMMUNITY)
Admission: RE | Admit: 2020-03-25 | Discharge: 2020-03-25 | Disposition: A | Discharge status: Home / Self Care | Payer: Medicaid Other | Source: Ambulatory Visit | Attending: Cardiovascular Disease | Admitting: Cardiovascular Disease

## 2020-03-25 DIAGNOSIS — Z20822 Contact with and (suspected) exposure to covid-19: Secondary | ICD-10-CM | POA: Diagnosis not present

## 2020-03-25 DIAGNOSIS — Z01812 Encounter for preprocedural laboratory examination: Secondary | ICD-10-CM | POA: Insufficient documentation

## 2020-03-25 LAB — SARS CORONAVIRUS 2 (TAT 6-24 HRS): SARS Coronavirus 2: NEGATIVE

## 2020-03-27 ENCOUNTER — Other Ambulatory Visit: Payer: Self-pay

## 2020-03-27 ENCOUNTER — Ambulatory Visit (HOSPITAL_BASED_OUTPATIENT_CLINIC_OR_DEPARTMENT_OTHER): Payer: Medicaid Other | Attending: Cardiovascular Disease | Admitting: Cardiovascular Disease

## 2020-03-27 DIAGNOSIS — G4733 Obstructive sleep apnea (adult) (pediatric): Secondary | ICD-10-CM | POA: Diagnosis not present

## 2020-03-29 ENCOUNTER — Encounter (HOSPITAL_BASED_OUTPATIENT_CLINIC_OR_DEPARTMENT_OTHER): Payer: Self-pay | Admitting: Cardiovascular Disease

## 2020-03-29 NOTE — Procedures (Signed)
      Patient Name: Julia Roberts, Julia Roberts Study Date: 03/27/2020 Gender: Female D.O.B: 1986/07/28 Age (years): 33 Referring Provider: Lorretta Harp Height (inches): 67 Interpreting Physician: Shelva Majestic MD, [facility] Weight (lbs): 269 RPSGT: Carolin Coy BMI: 42 MRN: [id number] Neck Size: 15.00  CLINICAL INFORMATION The patient is referred for a BiPAP titration to treat sleep apnea.  Date of NPSG: 01/14/2020:  AHI 64./h; RDI 7.2/h; supine sleep AHI 13.6/h; REM AHI 16.6/h; O2 nadir 84%.  SLEEP STUDY TECHNIQUE As per the AASM Manual for the Scoring of Sleep and Associated Events v2.3 (April 2016) with a hypopnea requiring 4% desaturations.  The channels recorded and monitored were frontal, central and occipital EEG, electrooculogram (EOG), submentalis EMG (chin), nasal and oral airflow, thoracic and abdominal wall motion, anterior tibialis EMG, snore microphone, electrocardiogram, and pulse oximetry. Bilevel positive airway pressure (BPAP) was initiated at the beginning of the study and titrated to treat sleep-disordered breathing.  MEDICATIONS aspirin-acetaminophen-caffeine (EXCEDRIN MIGRAINE) 250-250-65 MG tablet ferrous sulfate 325 (65 FE) MG tablet  Medications self-administered by patient taken the night of the study : N/A  RESPIRATORY PARAMETERS Optimal IPAP Pressure (cm): 21 AHI at Optimal Pressure (/hr) 15.0 Optimal EPAP Pressure (cm): 17   Overall Minimal O2 (%): 88.0 Minimal O2 at Optimal Pressure (%): 96.0  SLEEP ARCHITECTURE Start Time: 10:50:43 PM Stop Time: 4:48:47 AM Total Time (min): 358.1 Total Sleep Time (min): 226 Sleep Latency (min): 38.2 Sleep Efficiency (%): 63.1% REM Latency (min): 100.5 WASO (min): 93.9 Stage N1 (%): 9.7% Stage N2 (%): 86.9% Stage N3 (%): 0.2% Stage R (%): 3.1 Supine (%): 13.72 Arousal Index (/hr): 29.2   CARDIAC DATA The 2 lead EKG demonstrated sinus rhythm. The mean heart rate was 60.2 beats per minute. Other EKG findings include:  PVCs.  LEG MOVEMENT DATA The total Periodic Limb Movements of Sleep (PLMS) were 0. The PLMS index was 0.0. A PLMS index of <15 is considered normal in adults.  IMPRESSIONS - CPAP was initiated at 5 cm and was titrated to 17 cm with minimal if any REM sleep. BiPAP was stared at 19/15 and was titrated to 21/17 cm with AHI 15/h and O2 nadir at 96%.  - Central sleep apnea was not noted during this titration (CAI = 1.1/h). - Mild oxygen desaturation to a nadir of 88% at 9 cm. - The patient snored with soft snoring volume. - 2-lead EKG demonstrated: frequent PVCs - Clinically significant periodic limb movements were not noted during this study. Arousals associated with PLMs were rare.  DIAGNOSIS - Obstructive Sleep Apnea (327.23 [G47.33 ICD-10])  RECOMMENDATIONS - Recommend an initial trial of BiPAP Auto therapy with EPAP min of 14, PS of 5 and IPAP max at 25 cm H2O with heated humidification. A Medium size Philips Respironics Full Face Mask Dreamwear mask was used for the titration.  - Effort should be made to optimize nasal and oropharyngeal patency. - Avoid alcohol, sedatives and other CNS depressants that may worsen sleep apnea and disrupt normal sleep architecture. - Sleep hygiene should be reviewed to assess factors that may improve sleep quality. - Weight management and regular exercise should be initiated or continued. - Recommend a download in 30 days and sleep clinic evaluation after 4 weeks of therapy.  [Electronically signed] 03/29/2020 01:48 PM  Shelva Majestic MD, [facility], [place], American Board of Sleep Medicine   NPI: [id number] [place] PH: [phone]   FX: [phone] [place]

## 2020-04-16 ENCOUNTER — Telehealth: Payer: Self-pay | Admitting: *Deleted

## 2020-04-16 DIAGNOSIS — G4733 Obstructive sleep apnea (adult) (pediatric): Secondary | ICD-10-CM

## 2020-04-16 NOTE — Telephone Encounter (Signed)
Patient is returning call.  °

## 2020-04-16 NOTE — Telephone Encounter (Signed)
-----   Message from Julia Sine, MD sent at 03/29/2020  1:52 PM EDT ----- Julia Roberts, please notify pt and have DME set up with BiPAP

## 2020-04-16 NOTE — Telephone Encounter (Signed)
Called lmtcb on cell.

## 2020-04-17 NOTE — Telephone Encounter (Addendum)
Informed patient of sleep titration results and patient understanding was verbalized. Patient understands her sleep study showed : IMPRESSIONS - CPAP was initiated at 5 cm and was titrated to 17 cm with minimal if any REM sleep. BiPAP was stared at 19/15 and was titrated to 21/17 cm with AHI 15/h and O2 nadir at 96%.  - Mild oxygen desaturation to a nadir of 88% at 9 cm. - The patient snored with soft snoring volume. - 2-lead EKG demonstrated: frequent PVCs  DIAGNOSIS - Obstructive Sleep Apnea (327.23 [G47.33 ICD-10])  RECOMMENDATIONS - Recommend an initial trial of BiPAP Auto therapy with EPAP min of 14, PS of 5 and IPAP max at 25 cm H2O with heated humidification. A Medium size Philips Respironics Full Face Mask Dreamwear mask was used for the titration

## 2020-05-21 NOTE — Addendum Note (Signed)
Addended by: Freada Bergeron on: 05/21/2020 06:30 PM   Modules accepted: Orders

## 2020-05-21 NOTE — Telephone Encounter (Signed)
Upon patient request DME selection is [facility]. Patient understands she will be contacted by [facility] to set up her cpap. Patient understands to call if [facility] does not contact her with new setup in a timely manner. Patient understands they will be called once confirmation has been received from [facility] that they have received their new machine to schedule 10 week follow up appointment.  [facility] notified of new cpap order  Please add to airview Patient was grateful for the call and thanked me. 

## 2020-06-13 ENCOUNTER — Other Ambulatory Visit: Payer: Self-pay

## 2020-06-13 ENCOUNTER — Ambulatory Visit: Payer: Medicaid Other | Admitting: Cardiovascular Disease

## 2020-06-20 ENCOUNTER — Other Ambulatory Visit: Payer: Self-pay

## 2020-06-20 ENCOUNTER — Ambulatory Visit (INDEPENDENT_AMBULATORY_CARE_PROVIDER_SITE_OTHER): Payer: Medicaid Other | Admitting: Cardiovascular Disease

## 2020-06-20 ENCOUNTER — Encounter: Payer: Self-pay | Admitting: Cardiovascular Disease

## 2020-06-20 VITALS — BP 120/78 | HR 77 | Ht 67.0 in | Wt 266.0 lb

## 2020-06-20 DIAGNOSIS — I493 Ventricular premature depolarization: Secondary | ICD-10-CM

## 2020-06-20 DIAGNOSIS — R0683 Snoring: Secondary | ICD-10-CM | POA: Diagnosis not present

## 2020-06-20 DIAGNOSIS — R002 Palpitations: Secondary | ICD-10-CM

## 2020-06-20 DIAGNOSIS — G4733 Obstructive sleep apnea (adult) (pediatric): Secondary | ICD-10-CM

## 2020-06-20 DIAGNOSIS — R351 Nocturia: Secondary | ICD-10-CM

## 2020-06-20 DIAGNOSIS — G4719 Other hypersomnia: Secondary | ICD-10-CM

## 2020-06-20 NOTE — Progress Notes (Signed)
Cardiology Office Note    Date:  06/22/2020   ID:  LETECIA ARPS, DOB 09-03-1986, MRN [id number]  PCP:  Ladell Pier, MD  Cardiologist:  Shelva Majestic, MD (sleep); Dr. Quay Burow  New sleep evaluation  History of Present Illness:  Julia Roberts is a 34 y.o. female who is followed by Dr. Gwenlyn Found for cardiology care.  She has a history of morbid obesity, and recently was evaluated for a cardiac murmur and palpitations.  She denied any significant cardiac risk factors other than a father who had suffered a myocardial infarction in his late 81s.  She had experienced some atypical chest pain.  She had been evaluated in the emergency room.  A 2-week Zio patch showed frequent PVCs with occasional winky block block and a 2D echo Doppler study was essentially normal.  She was felt to have signs and symptoms consistent with obstructive sleep apnea with significant snoring, daytime somnolence, frequent awakenings as well as significant nocturia.  She was referred for a diagnostic polysomnogram which was done on January 14, 2020.  This revealed severe sleep apnea with an AHI of 6.4/h, RDI 7.2/h; REM sleep AHI 16.6/h, supine sleep AHI 13.6/h and she had significant oxygen desaturation to a nadir of 84%.  She had reduced sleep efficiency at only 58% and wake after sleep onset was 77 minutes.  She had occasional PVCs noted throughout sleep.  She was referred for a titration study and CPAP was titrated to 17 cm with minimal if any REM sleep.  She was transitioned to BiPAP therapy and ultimately was titrated up to 21/17 cm with an AHI of 15 and O2 nadir at 96%.  It was recommended that she initiate BiPAP auto therapy with an EPAP minimum of 14, pressure support of 5, and IPAP maximum of 25 with heated humidification.  Julia Roberts had just recently received her BiPAP therapy and apparently has only had her device for 10 days.  Compliance during this time has been poor with only 5 out of 10 days use.   However her AHI when used was excellent at 1.1 with a 95th percentile pressure at 17.4/12.4.  She continues to experience daytime sleepiness and an Epworth Sleepiness Scale score was calculated in the office today and endorsed at 12 as shown below.   Epworth Sleepiness Scale: Situation   Chance of Dozing/Sleeping (0 = never , 1 = slight chance , 2 = moderate chance , 3 = high chance )   sitting and reading 2   watching TV 3   sitting inactive in a public place 1   being a passenger in a motor vehicle for an hour or more 2   lying down in the afternoon 2   sitting and talking to someone 2   sitting quietly after lunch (no alcohol) 0   while stopped for a few minutes in traffic as the driver 0   Total Score  12   Prior to CPAP therapy she admits to significant snoring, nocturia at least 5-6 times per night, fatigability, frequent awakenings, and daytime sleepiness.  She presents for her initial evaluation with me.  Past Medical History:  Diagnosis Date  . Anemia   . Chlamydia   . ECZEMA, ATOPIC DERMATITIS 12/02/2006   Qualifier: Diagnosis of  By: Herma Ard    . Gonorrhea   . Migraines   . OBESITY, NOS 12/02/2006   Qualifier: Diagnosis of  By: Herma Ard      Past Surgical  History:  Procedure Laterality Date  . VAGINAL DELIVERY  2010    Current Medications: Outpatient Medications Prior to Visit  Medication Sig Dispense Refill  . aspirin-acetaminophen-caffeine (EXCEDRIN MIGRAINE) 250-250-65 MG tablet Take 1 tablet by mouth every 6 (six) hours as needed for headache.    . ferrous sulfate 325 (65 FE) MG tablet Take 325 mg by mouth daily with breakfast.     No facility-administered medications prior to visit.     Allergies:   Patient has no known allergies.   Social History   Socioeconomic History  . Marital status: Single    Spouse name: Not on file  . Number of children: 1  . Years of education: some college  . Highest education level: Not on file   Occupational History  . Not on file  Tobacco Use  . Smoking status: Never Smoker  . Smokeless tobacco: Never Used  Vaping Use  . Vaping Use: Never used  Substance and Sexual Activity  . Alcohol use: No  . Drug use: No  . Sexual activity: Yes    Birth control/protection: None  Other Topics Concern  . Not on file  Social History Narrative  . Not on file   Social Determinants of Health   Financial Resource Strain:   . Difficulty of Paying Living Expenses: Not on file  Food Insecurity:   . Worried About Charity fundraiser in the Last Year: Not on file  . Ran Out of Food in the Last Year: Not on file  Transportation Needs:   . Lack of Transportation (Medical): Not on file  . Lack of Transportation (Non-Medical): Not on file  Physical Activity:   . Days of Exercise per Week: Not on file  . Minutes of Exercise per Session: Not on file  Stress:   . Feeling of Stress : Not on file  Social Connections:   . Frequency of Communication with Friends and Family: Not on file  . Frequency of Social Gatherings with Friends and Family: Not on file  . Attends Religious Services: Not on file  . Active Member of Clubs or Organizations: Not on file  . Attends Archivist Meetings: Not on file  . Marital Status: Not on file    She is single.  She has a 34 year old daughter.  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  Family History:  The patient's family history includes Diabetes in her maternal aunt; Hypertension in her father; Migraines in her mother.  Her mother is 29 and has anemia, father is 49 and has sleep apnea and has suffered a stroke and has CAD.  She has 2 brothers ages 57 and 25 with a history of hypertension.  ROS General: Negative; No fevers, chills, or night sweats; morbid obesity HEENT: Negative; No changes in vision or hearing,  sinus congestion, difficulty swallowing Pulmonary: Negative; No cough, wheezing, shortness of breath, hemoptysis Cardiovascular: palpitations, atypical chest pain GI: Negative; No nausea, vomiting, diarrhea, or abdominal pain GU: Negative; No dysuria, hematuria, or difficulty voiding Musculoskeletal: Negative; no myalgias, joint pain, or weakness Hematologic/Oncology: Negative; no easy bruising, bleeding Endocrine: Negative; no heat/cold intolerance; no diabetes Neuro: Negative; no changes in balance, headaches Skin: Negative; No rashes or skin lesions Psychiatric: Negative; No behavioral problems, depression Sleep:  snoring, daytime sleepiness, hypersomnolence; no bruxism, restless legs, hypnogognic hallucinations, no cataplexy Other comprehensive 14 point system  review is negative.   PHYSICAL EXAM:   VS:  BP 120/78   Pulse 77   Ht _0  (1.702 m)   Wt 266 lb (120.7 kg)   BMI 41.66 kg/m     Blood pressure by me 12/74  Wt Readings from Last 3 Encounters:  06/20/20 266 lb (120.7 kg)  03/27/20 269 lb (122 kg)  01/14/20 269 lb (122 kg)    General: Alert, oriented, no distress.  Skin: normal turgor, no rashes, warm and dry HEENT: Normocephalic, atraumatic. Pupils equal round and reactive to light; sclera anicteric; extraocular muscles intact; Nose without nasal septal hypertrophy Mouth/Parynx benign; Mallinpatti scale 3/4 Neck: Thick neck; no JVD, no carotid bruits; normal carotid upstroke Lungs: clear to ausculatation and percussion; no wheezing or rales Chest wall: without tenderness to palpitation Heart: PMI not displaced, RRR, s1 s2 normal, 1/6 systolic murmur, no diastolic murmur, no rubs, gallops, thrills, or heaves Abdomen: Central adiposity soft, nontender; no hepatosplenomehaly, BS+; abdominal aorta nontender and not dilated by palpation. Back: no CVA tenderness Pulses 2+ Musculoskeletal: full range of motion, normal strength, no joint deformities Extremities: no  clubbing cyanosis or edema, Homan's sign negative  Neurologic: grossly nonfocal; Cranial nerves grossly wnl Psychologic: Normal mood and affect   Studies/Labs Reviewed:   EKG:  EKG is not  ordered today.  I personally reviewed the ECG from November 28, 2019 which showed sinus rhythm at 80 with PVC.  Recent Labs: BMP Latest Ref Rng & Units 11/27/2019 11/24/2018 09/10/2017  Glucose 70 - 99 mg/dL 98 92 113(H)  BUN 6 - 20 mg/dL _1 Creatinine 0.44 - 1.00 mg/dL 0.70 0.75 0.71  BUN/Creat Ratio 9 - 23 - 12 -  Sodium 135 - 145 mmol/L 138 137 136  Potassium 3.5 - 5.1 mmol/L 3.7 4.6 4.1  Chloride 98 - 111 mmol/L 108 101 106  CO2 22 - 32 mmol/L _2 Calcium 8.9 - 10.3 mg/dL 9.5 9.6 9.3     Hepatic Function Latest Ref Rng & Units 11/30/2019 11/24/2018 01/15/2015  Total Protein 6.0 - 8.5 g/dL 7.0 7.2 7.5  Albumin 3.8 - 4.8 g/dL 4.3 4.3 4.2  AST 0 - 40 IU/L _3 ALT 0 - 32 IU/L _4 Alk Phosphatase 39 - 117 IU/L 65 62 59  Total Bilirubin 0.0 - 1.2 mg/dL 0.4 0.5 0.7  Bilirubin, Direct 0.00 - 0.40 mg/dL 0.14 - -    CBC Latest Ref Rng & Units 11/27/2019 02/10/2019 01/10/2019  WBC 4.0 - 10.5 K/uL 4.2 3.4 3.6  Hemoglobin 12.0 - 15.0 g/dL 9.1(L) 11.6 8.8(L)  Hematocrit 36 - 46 % 33.9(L) 38.6 31.8(L)  Platelets 150 - 400 K/uL 234 304 279   Lab Results  Component Value Date   MCV 72.7 (L) 11/27/2019   MCV 75 (L) 02/10/2019   MCV 73 (L) 01/10/2019   No results found for: TSH Lab Results  Component Value Date   HGBA1C 4.8 11/24/2018     BNP No results found for: BNP  ProBNP No results found for: PROBNP   Lipid Panel     Component Value Date/Time   CHOL 119 11/30/2019 0814   TRIG 43 11/30/2019 0814   HDL 51 11/30/2019 0814   CHOLHDL 2.3 11/30/2019 0814   LDLCALC 57 11/30/2019 0814   LABVLDL 11 11/30/2019 0814     RADIOLOGY: No results found.   Additional studies/ records that were reviewed today include:  I reviewed the records of  Dr. Gwenlyn Found.  I reviewed the  patient's diagnostic polysomnogram as well as her CPAP/BiPAP titration study.  A download was obtained in the office today since initiation of therapy and was reviewed with the patient.  An Epworth Sleepiness Scale score was calculated.   ASSESSMENT:    1. OSA (obstructive sleep apnea)   2. PVC's (premature ventricular contractions)   3. Palpitations   4. Snoring   5. Excessive daytime sleepiness   6. Nocturia   7. Morbid obesity [facility])      PLAN:  Julia Roberts is a 34 year old African-American female who has a longstanding history of morbid obesity and has recently experienced palpitations.  She has been documented to have normal systolic function on echocardiography in March 2021 and did not have significant valvular heart disease.  She has had issues with palpitations which have been documented to be PVCs.  She has symptoms consistent with obstructive sleep apnea including snoring, nocturia at least 5-6 times per night, frequent awakenings, nonrestorative sleep, as well as excessive daytime sleepiness.  I reviewed her diagnostic polysomnogram as well as her CPAP titration study with her in detail.  I also discussed the effects of untreated sleep apnea on her cardiovascular health and in particular its potential contribution to her nocturnal palpitations, nocturia, as well as potential issues regarding hypertension control, PAF, nocturnal ischemia and its effects on glucose metabolism as well as GERD.  I discussed the importance of weight loss particular with her BMI of 41.7.  On her CPAP titration trial she was transitioned to BiPAP and required high pressure with ultimate titration up to 21/17.  Her BiPAP has been set with auto therapy particularly since AHI at 21/17 was still elevated at 15/h.  She has just started CPAP therapy and apparently is not compliant and I has only used treatment 5 out of 10 days since she has had her device.  I explained with her the importance of meeting  compliance standards and if she is not compliant within 90 days insurance company may cause her to return her machine.  We discussed optimal sleep duration at approximately 8 hours if at all possible.  We discussed the importance of continuing to use CPAP throughout the nights entirety particularly since the preponderance of REM sleep occurs in the second half of sleep.  We discussed the pathophysiology associated with her nocturia of 5-6 times per night which should significantly improve with treatment of her sleep apnea.  We also discussed the importance of increased exercise with heart association recommendations of 5 days/week for at least 30 minutes if at all possible.  I have answered all her questions.  I have recommended she see me back in 6 to 8 weeks prior to her 90-day window following BiPAP initiation.   Medication Adjustments/Labs and Tests Ordered: Current medicines are reviewed at length with the patient today.  Concerns regarding medicines are outlined above.  Medication changes, Labs and Tests ordered today are listed in the Patient Instructions below. Patient Instructions  Medication Instructions:  CONTINUE WITH CURRENT MEDICATIONS. NO CHANGES.  *If you need a refill on your cardiac medications before your next appointment, please call your pharmacy*   Follow-Up: At [facility], you and your health needs are our priority.  As part of our continuing mission to provide you with exceptional heart care, we have created designated Provider Care Teams.  These Care Teams include your primary Cardiologist (physician) and Advanced Practice Providers (APPs -  Physician Assistants and Nurse Practitioners) who  all work together to provide you with the care you need, when you need it.  We recommend signing up for the patient portal called "MyChart".  Sign up information is provided on this After Visit Summary.  MyChart is used to connect with patients for Virtual Visits (Telemedicine).   Patients are able to view lab/test results, encounter notes, upcoming appointments, etc.  Non-urgent messages can be sent to your provider as well.   To learn more about what you can do with MyChart, go to [email or web address].    Your next appointment:   6-8 week(s)  The format for your next appointment:   In Person  Provider:   Shelva Majestic, MD       Signed, Shelva Majestic, MD  06/22/2020 3:11 PM    [place] [address], [place], [place], [place]  [address] Phone: [phone]

## 2020-06-20 NOTE — Patient Instructions (Addendum)
Medication Instructions:  CONTINUE WITH CURRENT MEDICATIONS. NO CHANGES.  *If you need a refill on your cardiac medications before your next appointment, please call your pharmacy*   Follow-Up: At [facility], you and your health needs are our priority.  As part of our continuing mission to provide you with exceptional heart care, we have created designated Provider Care Teams.  These Care Teams include your primary Cardiologist (physician) and Advanced Practice Providers (APPs -  Physician Assistants and Nurse Practitioners) who all work together to provide you with the care you need, when you need it.  We recommend signing up for the patient portal called "MyChart".  Sign up information is provided on this After Visit Summary.  MyChart is used to connect with patients for Virtual Visits (Telemedicine).  Patients are able to view lab/test results, encounter notes, upcoming appointments, etc.  Non-urgent messages can be sent to your provider as well.   To learn more about what you can do with MyChart, go to [email or web address].    Your next appointment:   6-8 week(s)  The format for your next appointment:   In Person  Provider:   Shelva Majestic, MD

## 2020-06-22 ENCOUNTER — Encounter: Payer: Self-pay | Admitting: Cardiovascular Disease

## 2020-06-24 ENCOUNTER — Encounter (HOSPITAL_BASED_OUTPATIENT_CLINIC_OR_DEPARTMENT_OTHER): Payer: Medicaid Other | Admitting: Cardiovascular Disease

## 2020-09-03 ENCOUNTER — Ambulatory Visit: Payer: Medicaid Other | Admitting: Cardiovascular Disease

## 2021-01-03 ENCOUNTER — Ambulatory Visit: Payer: Medicaid Other | Admitting: Cardiovascular Disease

## 2021-03-10 ENCOUNTER — Ambulatory Visit (INDEPENDENT_AMBULATORY_CARE_PROVIDER_SITE_OTHER): Payer: Medicaid Other | Admitting: Cardiovascular Disease

## 2021-03-10 ENCOUNTER — Other Ambulatory Visit: Payer: Self-pay

## 2021-03-10 ENCOUNTER — Encounter: Payer: Self-pay | Admitting: Cardiovascular Disease

## 2021-03-10 VITALS — BP 118/79 | HR 74 | Ht 67.0 in | Wt 266.0 lb

## 2021-03-10 DIAGNOSIS — G4719 Other hypersomnia: Secondary | ICD-10-CM | POA: Diagnosis not present

## 2021-03-10 DIAGNOSIS — G4733 Obstructive sleep apnea (adult) (pediatric): Secondary | ICD-10-CM

## 2021-03-10 DIAGNOSIS — R002 Palpitations: Secondary | ICD-10-CM | POA: Diagnosis not present

## 2021-03-10 DIAGNOSIS — R0683 Snoring: Secondary | ICD-10-CM

## 2021-03-10 MED ORDER — METOPROLOL TARTRATE 25 MG PO TABS: 25.0000 mg | ORAL_TABLET | ORAL | 3 refills | Status: DC | PRN

## 2021-03-10 NOTE — Patient Instructions (Addendum)
Medication Instructions:  BEGIN metoprolol tartrate 25mg  as needed for palpitations.   *If you need a refill on your cardiac medications before your next appointment, please call your pharmacy*   Lab Work: None ordered.    Testing/Procedures: None ordered.    Follow-Up: At [facility], you and your health needs are our priority.  As part of our continuing mission to provide you with exceptional heart care, we have created designated Provider Care Teams.  These Care Teams include your primary Cardiologist (physician) and Advanced Practice Providers (APPs -  Physician Assistants and Nurse Practitioners) who all work together to provide you with the care you need, when you need it.  We recommend signing up for the patient portal called "MyChart".  Sign up information is provided on this After Visit Summary.  MyChart is used to connect with patients for Virtual Visits (Telemedicine).  Patients are able to view lab/test results, encounter notes, upcoming appointments, etc.  Non-urgent messages can be sent to your provider as well.   To learn more about what you can do with MyChart, go to [email or web address].    Your next appointment:   Follow up as needed.

## 2021-03-10 NOTE — Progress Notes (Signed)
Cardiology Office Note    Date:  03/15/2021   ID:  Julia Roberts, DOB 1986-09-19, MRN [id number]  PCP:  Ladell Pier, MD  Cardiologist:  Shelva Majestic, MD (sleep); Dr. Quay Burow  F/U sleep evaluation  History of Present Illness:  Julia Roberts is a 35 y.o. female who is followed by Dr. Gwenlyn Found for cardiology care.  She has a history of morbid obesity, and recently was evaluated for a cardiac murmur and palpitations.  She denied any significant cardiac risk factors other than a father who had suffered a myocardial infarction in his late 78s.  She had experienced some atypical chest pain.  She had been evaluated in the emergency room.  A 2-week Zio patch showed frequent PVCs with occasional winky block block and a 2D echo Doppler study was essentially normal.  She was felt to have signs and symptoms consistent with obstructive sleep apnea with significant snoring, daytime somnolence, frequent awakenings as well as significant nocturia.  She was referred for a diagnostic polysomnogram which was done on January 14, 2020.  This revealed severe sleep apnea with an AHI of 6.4/h, RDI 7.2/h; REM sleep AHI 16.6/h, supine sleep AHI 13.6/h and she had significant oxygen desaturation to a nadir of 84%.  She had reduced sleep efficiency at only 58% and wake after sleep onset was 77 minutes.  She had occasional PVCs noted throughout sleep.  She was referred for a titration study and CPAP was titrated to 17 cm with minimal if any REM sleep.  She was transitioned to BiPAP therapy and ultimately was titrated up to 21/17 cm with an AHI of 15 and O2 nadir at 96%.  It was recommended that she initiate BiPAP auto therapy with an EPAP minimum of 14, pressure support of 5, and IPAP maximum of 25 with heated humidification.  [name] had just recently received her BiPAP therapy and apparently has only had her device for 10 days.  Compliance during this time has been poor with only 5 out of 10 days use.   However her AHI when used was excellent at 1.1 with a 95th percentile pressure at 17.4/12.4.  She continues to experience daytime sleepiness and an Epworth Sleepiness Scale score was calculated in the office today and endorsed at 12 as shown below.   Epworth Sleepiness Scale: Situation   Chance of Dozing/Sleeping (0 = never , 1 = slight chance , 2 = moderate chance , 3 = high chance )   sitting and reading 2   watching TV 3   sitting inactive in a public place 1   being a passenger in a motor vehicle for an hour or more 2   lying down in the afternoon 2   sitting and talking to someone 2   sitting quietly after lunch (no alcohol) 0   while stopped for a few minutes in traffic as the driver 0   Total Score  12   Prior to CPAP therapy she admitted to significant snoring, nocturia at least 5-6 times per night, fatigability, frequent awakenings, and daytime sleepiness.    I saw her on June 20, 2020 for her initial evaluation with me.  During that evaluation I had a lengthy discussion with her and reviewed adverse consequences of untreated sleep apnea with reference to her cardiovascular health and specifically its effects on blood pressure, nocturnal ischemia, nocturnal arrhythmias, effects on glucose metabolism, GERD and inflammation.  We discussed the importance of weight reduction with her morbid  obesity and BMI of 41.7.  We discussed the importance of meeting compliance standards within 90 days otherwise the insurance companies refused to pay her machine off and is returned.  Unfortunately, since her evaluation with me, her machine was picked up due to noncompliance.  She basically has noticed more palpitations.  She admits to having fatigue, snoring and being tired.  Oftentimes she wakes up at least 5-6 times per night to urinate.  She denies any chest pain.  She is unaware of any nocturnal arrhythmias.  An Epworth scale was recalculated in the office today and this endorsed at 12 which is  consistent with excessive daytime sleepiness.  She presents for evaluation   Past Medical History:  Diagnosis Date   Anemia    Chlamydia    ECZEMA, ATOPIC DERMATITIS 12/02/2006   Qualifier: Diagnosis of  By: Herma Ard     Gonorrhea    Migraines    OBESITY, NOS 12/02/2006   Qualifier: Diagnosis of  By: Herma Ard      Past Surgical History:  Procedure Laterality Date   VAGINAL DELIVERY  2010    Current Medications: Outpatient Medications Prior to Visit  Medication Sig Dispense Refill   ferrous sulfate 325 (65 FE) MG tablet Take 325 mg by mouth daily with breakfast.     aspirin-acetaminophen-caffeine (EXCEDRIN MIGRAINE) 250-250-65 MG tablet Take 1 tablet by mouth every 6 (six) hours as needed for headache. (Patient not taking: Reported on 03/10/2021)     No facility-administered medications prior to visit.     Allergies:   Patient has no known allergies.   Social History   Socioeconomic History   Marital status: Single    Spouse name: Not on file   Number of children: 1   Years of education: some college   Highest education level: Not on file  Occupational History   Not on file  Tobacco Use   Smoking status: Never   Smokeless tobacco: Never  Vaping Use   Vaping Use: Never used  Substance and Sexual Activity   Alcohol use: No   Drug use: No   Sexual activity: Yes    Birth control/protection: None  Other Topics Concern   Not on file  Social History Narrative   Not on file   Social Determinants of Health   Financial Resource Strain: Not on file  Food Insecurity: Not on file  Transportation Needs: Not on file  Physical Activity: Not on file  Stress: Not on file  Social Connections: Not on file    She is single.  She has a 8 year old daughter.  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  Family History:  The  patient's family history includes Diabetes in her maternal aunt; Hypertension in her father; Migraines in her mother.  Her mother is 8 and has anemia, father is 61 and has sleep apnea and has suffered a stroke and has CAD.  She has 2 brothers ages 49 and 43 with a history of hypertension.  ROS General: Negative; No fevers, chills, or night sweats; morbid obesity HEENT: Negative; No changes in vision or hearing, sinus congestion, difficulty swallowing Pulmonary: Negative; No cough, wheezing, shortness of breath, hemoptysis Cardiovascular: palpitations, atypical chest pain GI: Negative; No nausea, vomiting, diarrhea, or abdominal pain GU: Negative; No dysuria, hematuria, or difficulty voiding Musculoskeletal: Negative; no myalgias, joint pain, or weakness Hematologic/Oncology: Negative; no easy  bruising, bleeding Endocrine: Negative; no heat/cold intolerance; no diabetes Neuro: Negative; no changes in balance, headaches Skin: Negative; No rashes or skin lesions Psychiatric: Negative; No behavioral problems, depression Sleep:  snoring, daytime sleepiness, hypersomnolence; no bruxism, restless legs, hypnogognic hallucinations, no cataplexy Other comprehensive 14 point system review is negative.   PHYSICAL EXAM:   VS:  BP 118/79 (BP Location: Left Arm, Patient Position: Sitting, Cuff Size: Large)   Pulse 74   Ht 5' 7"  (1.702 m)   Wt 266 lb (120.7 kg)   SpO2 99%   BMI 41.66 kg/m     Repeat blood pressure by me was 112/70  Wt Readings from Last 3 Encounters:  03/10/21 266 lb (120.7 kg)  06/20/20 266 lb (120.7 kg)  03/27/20 269 lb (122 kg)    General: Alert, oriented, no distress.  Skin: normal turgor, no rashes, warm and dry HEENT: Normocephalic, atraumatic. Pupils equal round and reactive to light; sclera anicteric; extraocular muscles intact;  Nose without nasal septal hypertrophy Mouth/Parynx: tongue earing; Mallinpatti scale 3/4 Neck: No JVD, no carotid bruits; normal carotid  upstroke Lungs: clear to ausculatation and percussion; no wheezing or rales Chest wall: without tenderness to palpitation Heart: PMI not displaced, RRR, s1 s2 normal, 1/6 systolic murmur, no diastolic murmur, no rubs, gallops, thrills, or heaves Abdomen: soft, nontender; no hepatosplenomehaly, BS+; abdominal aorta nontender and not dilated by palpation. Back: no CVA tenderness Pulses 2+ Musculoskeletal: full range of motion, normal strength, no joint deformities Extremities: no clubbing cyanosis or edema, Homan's sign negative  Neurologic: grossly nonfocal; Cranial nerves grossly wnl Psychologic: Normal mood and affect   Studies/Labs Reviewed:   ECG (independently read by me): NSR at 74; PVCs, nonspecific T wave abnormality; normal intervals  EKG:  EKG is not  ordered today.  I personally reviewed the ECG from November 28, 2019 which showed sinus rhythm at 80 with PVC.  Recent Labs: BMP Latest Ref Rng & Units 11/27/2019 11/24/2018 09/10/2017  Glucose 70 - 99 mg/dL 98 92 113(H)  BUN 6 - 20 mg/dL 9 9 6   Creatinine 0.44 - 1.00 mg/dL 0.70 0.75 0.71  BUN/Creat Ratio 9 - 23 - 12 -  Sodium 135 - 145 mmol/L 138 137 136  Potassium 3.5 - 5.1 mmol/L 3.7 4.6 4.1  Chloride 98 - 111 mmol/L 108 101 106  CO2 22 - 32 mmol/L 23 22 23   Calcium 8.9 - 10.3 mg/dL 9.5 9.6 9.3     Hepatic Function Latest Ref Rng & Units 11/30/2019 11/24/2018 01/15/2015  Total Protein 6.0 - 8.5 g/dL 7.0 7.2 7.5  Albumin 3.8 - 4.8 g/dL 4.3 4.3 4.2  AST 0 - 40 IU/L 18 21 22   ALT 0 - 32 IU/L 16 18 20   Alk Phosphatase 39 - 117 IU/L 65 62 59  Total Bilirubin 0.0 - 1.2 mg/dL 0.4 0.5 0.7  Bilirubin, Direct 0.00 - 0.40 mg/dL 0.14 - -    CBC Latest Ref Rng & Units 11/27/2019 02/10/2019 01/10/2019  WBC 4.0 - 10.5 K/uL 4.2 3.4 3.6  Hemoglobin 12.0 - 15.0 g/dL 9.1(L) 11.6 8.8(L)  Hematocrit 36.0 - 46.0 % 33.9(L) 38.6 31.8(L)  Platelets 150 - 400 K/uL 234 304 279   Lab Results  Component Value Date   MCV 72.7 (L) 11/27/2019   MCV  75 (L) 02/10/2019   MCV 73 (L) 01/10/2019   No results found for: TSH Lab Results  Component Value Date   HGBA1C 4.8 11/24/2018     BNP No results found for:  BNP  ProBNP No results found for: PROBNP   Lipid Panel     Component Value Date/Time   CHOL 119 11/30/2019 0814   TRIG 43 11/30/2019 0814   HDL 51 11/30/2019 0814   CHOLHDL 2.3 11/30/2019 0814   LDLCALC 57 11/30/2019 0814   LABVLDL 11 11/30/2019 0814     RADIOLOGY: No results found.   Additional studies/ records that were reviewed today include:  I reviewed the records of Dr. Gwenlyn Found.  I reviewed the patient's diagnostic polysomnogram as well as her CPAP/BiPAP titration study.  A download was obtained in the office today since initiation of therapy and was reviewed with the patient.  An Epworth Sleepiness Scale score was calculated.   ASSESSMENT:    1. Palpitations   2. OSA (obstructive sleep apnea)   3. Morbid obesity ([place])   4. Excessive daytime sleepiness   5. Snoring      PLAN:  Ms. Amatullah Christy is a 35 year old African-American female who has a longstanding history of morbid obesity and has recently experienced palpitations.  She has been documented to have normal systolic function on echocardiography in March 2021 and did not have significant valvular heart disease.  She has had issues with palpitations which have been documented to be PVCs.  She has significant symptoms of sleep apnea including snoring, nocturia at least 5-6 times per night, frequent awakenings, nonrestorative sleep as well as excessive daytime sleepiness.  She was found to have sleep apnea on her diagnostic evaluation.  She required high pressures on CPAP and was transitioned to BiPAP therapy.  Unfortunately, she had issues with CPAP BiPAP tolerability and as result due to not being compliant with her machine was returned and she does not have treatment presently.  With her BMI of 41.66, she is not a candidate for inspire  technology.  I discussed alternatives to CPAP therapy such as a customized oral appliance.  She has noticed more palpitations since she has not been on treatment I have recommended the addition of metoprolol to take 25 mg on a as needed basis for palpitations.  We again discussed the importance of weight loss with her BMI consistent with morbid obesity.  I have given her several names of dentists for consideration of customized oral appliance if she has interest.  She will return to the primary cardiology care of Dr. Gwenlyn Found I will be available as needed    Medication Adjustments/Labs and Tests Ordered: Current medicines are reviewed at length with the patient today.  Concerns regarding medicines are outlined above.  Medication changes, Labs and Tests ordered today are listed in the Patient Instructions below. Patient Instructions  Medication Instructions:  BEGIN metoprolol tartrate 20m as needed for palpitations.   *If you need a refill on your cardiac medications before your next appointment, please call your pharmacy*   Lab Work: None ordered.    Testing/Procedures: None ordered.    Follow-Up: At [facility] you and your health needs are our priority.  As part of our continuing mission to provide you with exceptional heart care, we have created designated Provider Care Teams.  These Care Teams include your primary Cardiologist (physician) and Advanced Practice Providers (APPs -  Physician Assistants and Nurse Practitioners) who all work together to provide you with the care you need, when you need it.  We recommend signing up for the patient portal called "MyChart".  Sign up information is provided on this After Visit Summary.  MyChart is used to connect with patients for  Virtual Visits (Telemedicine).  Patients are able to view lab/test results, encounter notes, upcoming appointments, etc.  Non-urgent messages can be sent to your provider as well.   To learn more about what you can do  with MyChart, go to [email or web address].    Your next appointment:   Follow up as needed.    Signed, Shelva Majestic, MD  03/15/2021 4:18 PM    [facility] [address], [place], [place], [place]  [address] Phone: [phone]

## 2021-03-17 ENCOUNTER — Telehealth: Payer: Self-pay | Admitting: *Deleted

## 2021-03-17 NOTE — Telephone Encounter (Signed)
Faxed referral to Augustina Mood, DDS for oral evaluation to treat her OSA.

## 2021-03-19 ENCOUNTER — Telehealth: Payer: Self-pay | Admitting: *Deleted

## 2021-03-19 ENCOUNTER — Telehealth: Payer: Self-pay | Admitting: Cardiovascular Disease

## 2021-03-19 NOTE — Telephone Encounter (Signed)
Left message for pt to call back  °

## 2021-03-19 NOTE — Telephone Encounter (Signed)
Received a return fax from Dr Kerin Perna office that they cannot accept the patient referral for oral device evaluation. They do not take Medicaid. I called Dr Ron Parker office and they also do not accept Medicaid. Will notify ordering MD for further instructions.

## 2021-03-19 NOTE — Telephone Encounter (Signed)
Patient c/o Palpitations:  High priority if patient c/o lightheadedness, shortness of breath, or chest pain  How long have you had palpitations/irregular HR/ Afib? Are you having the symptoms now? A few years now  Are you currently experiencing lightheadedness, SOB or CP? Lightheadedness, sob  Do you have a history of afib (atrial fibrillation) or irregular heart rhythm? yes  Have you checked your BP or HR? (document readings if available): No  Are you experiencing any other symptoms? Pt turned in her CPAP machine a few months ago, pt started taking Metoprolol Tartrate 25 mg for her palpitations and the med is not working, pt still continues to have palpitations. Pt have not taken the prescribed meds today.

## 2021-03-21 NOTE — Telephone Encounter (Signed)
ok 

## 2021-03-25 NOTE — Telephone Encounter (Signed)
Returned call to patient who states she can feel her pulse throughout her body. She does not have a monitor so she cannot measure pulse but reports feeling her pulse at all times throughout the day. Denies that it feels irregular, just feels strong all the time. She is taking metoprolol 25 mg once daily. Reports good hydration with vitamin water and electrolyte water but does not eat on a consistent basis through the day - usually just breakfast and dinner. I asked if she could get either a BP cuff or pulse oximeter so that she can report HR readings to [place]. She also has questions about her CPAP machine and has not been wearing her equipment because she believes the settings are too high. I advised that I will forward the message to Dr. Claiborne Billings and the sleep team for advice regarding her equipment as her sleep apnea may be contributing. Scheduled first available appointment 8/3 with Dr. Claiborne Billings and advised her to call back if symptoms worsen or if she is able to get any HR readings. She verbalized understanding and agreement and thanked me for the call.

## 2021-03-27 NOTE — Telephone Encounter (Signed)
Patient is no longer using her CPAP machine. She was referred for oral device, however neither dentist that we refer to takes medicaid. Message was sent to Dr Claiborne Billings for further recommendations.

## 2021-04-08 ENCOUNTER — Ambulatory Visit: Payer: Medicaid Other | Admitting: Physician Assistant

## 2021-04-09 ENCOUNTER — Ambulatory Visit: Payer: Medicaid Other | Admitting: Physician Assistant

## 2021-05-07 ENCOUNTER — Encounter: Payer: Self-pay | Admitting: Cardiovascular Disease

## 2021-05-07 ENCOUNTER — Other Ambulatory Visit: Payer: Self-pay

## 2021-05-07 ENCOUNTER — Ambulatory Visit (INDEPENDENT_AMBULATORY_CARE_PROVIDER_SITE_OTHER): Payer: Medicaid Other | Admitting: Cardiovascular Disease

## 2021-05-07 VITALS — BP 118/72 | HR 70 | Ht 67.0 in | Wt 265.0 lb

## 2021-05-07 DIAGNOSIS — R0683 Snoring: Secondary | ICD-10-CM

## 2021-05-07 DIAGNOSIS — I493 Ventricular premature depolarization: Secondary | ICD-10-CM | POA: Diagnosis not present

## 2021-05-07 DIAGNOSIS — R0681 Apnea, not elsewhere classified: Secondary | ICD-10-CM

## 2021-05-07 DIAGNOSIS — G4719 Other hypersomnia: Secondary | ICD-10-CM

## 2021-05-07 DIAGNOSIS — G4733 Obstructive sleep apnea (adult) (pediatric): Secondary | ICD-10-CM | POA: Diagnosis not present

## 2021-05-07 DIAGNOSIS — R002 Palpitations: Secondary | ICD-10-CM

## 2021-05-07 NOTE — Progress Notes (Signed)
Cardiology Office Note    Date:  05/07/2021   ID:  [name], [name] 03-30-1986, MRN [id number]  PCP:  Julia Pier, MD  Cardiologist:  Julia Majestic, MD (sleep); Dr. Quay Roberts  F/U sleep evaluation  History of Present Illness:  Julia Roberts is a 35 y.o. female who is followed by Dr. Gwenlyn Roberts for cardiology care.  She has a history of morbid obesity, and recently was evaluated for a cardiac murmur and palpitations.  She denied any significant cardiac risk factors other than a father who had suffered a myocardial infarction in his late 20s.  She had experienced some atypical chest pain.  She had been evaluated in the emergency room.  A 2-week Zio patch showed frequent PVCs with occasional winky block block and a 2D echo Doppler study was essentially normal.  She was felt to have signs and symptoms consistent with obstructive sleep apnea with significant snoring, daytime somnolence, frequent awakenings as well as significant nocturia.  She was referred for a diagnostic polysomnogram which was done on January 14, 2020.  This conferred sleep apnea with an AHI of 6.4/h, RDI 7.2/h; REM sleep AHI 16.6/h, supine sleep AHI 13.6/h and she had significant oxygen desaturation to a nadir of 84%.  She had reduced sleep efficiency at only 58% and wake after sleep onset was 77 minutes.  She had occasional PVCs noted throughout sleep.  She was referred for a titration study and CPAP was titrated to 17 cm with minimal if any REM sleep.  She was transitioned to BiPAP therapy and ultimately was titrated up to 21/17 cm with an AHI of 15 and O2 nadir at 96%.  It was recommended that she initiate BiPAP auto therapy with an EPAP minimum of 14, pressure support of 5, and IPAP maximum of 25 with heated humidification.  When I saw her in September 2021 Julia Roberts had just recently received her BiPAP therapy and apparently has only had her device for 10 days.  Compliance during this time has been poor with only 5  out of 10 days use.  However her AHI when used was excellent at 1.1 with a 95th percentile pressure at 17.4/12.4.  She continues to experience daytime sleepiness and an Epworth Sleepiness Scale score was calculated in the office today and endorsed at 12 as shown below.   Epworth Sleepiness Scale: Situation   Chance of Dozing/Sleeping (0 = never , 1 = slight chance , 2 = moderate chance , 3 = high chance )   sitting and reading 2   watching TV 3   sitting inactive in a public place 1   being a passenger in a motor vehicle for an hour or more 2   lying down in the afternoon 2   sitting and talking to someone 2   sitting quietly after lunch (no alcohol) 0   while stopped for a few minutes in traffic as the driver 0   Total Score  12   Prior to CPAP therapy she admitted to significant snoring, nocturia at least 5-6 times per night, fatigability, frequent awakenings, and daytime sleepiness.    During her  June 20, 2020 initial evaluation with me I had a lengthy discussion with her and reviewed adverse consequences of untreated sleep apnea with reference to her cardiovascular health and specifically its effects on blood pressure, nocturnal ischemia, nocturnal arrhythmias, effects on glucose metabolism, GERD and inflammation.  We discussed the importance of weight reduction with her morbid obesity  and BMI of 41.7.  We discussed the importance of meeting compliance standards within 90 days otherwise the insurance companies refused to pay her machine off and is returned.  I saw her in follow-up in June 2022.  Unfortunately, since her initial evaluation with me, her machine was picked up due to noncompliance.  She basically has noticed more palpitations.  She admits to having fatigue, snoring and being tired.  She was waking up at least t 5-6 times per night to urinate.  She denied any chest pain.  She was unaware of any nocturnal arrhythmias.  An Epworth scale was recalculated in the office and this  endorsed at 12 which is consistent with excessive daytime sleepiness. When I saw her, I discussed other alternatives to CPAP or BiPAP therapy.  After much discussion she agreed to being evaluated for consideration for a customized oral appliance.  Unfortunately, we attempted to refer her to several dentists in town who performed this procedure.  Unfortunately they did not take Medicaid and as result she has not been contacted by their offices for evaluation and treatment  Presently, she continues to sleep very poorly.  She is now waking up 6-7 times a night for urination.  She has loud snoring, frequent awakenings, she has had witnessed apnea, and she continues to have residual daytime sleepiness.  She typically goes to bed around 9:30 PM and wakes up at 5:30 AM.  She has experienced palpitations both during the day as well as during the night.  She presents now for reevaluation.        Past Medical History:  Diagnosis Date   Anemia    Chlamydia    ECZEMA, ATOPIC DERMATITIS 12/02/2006   Qualifier: Diagnosis of  By: Julia Roberts     Gonorrhea    Migraines    OBESITY, NOS 12/02/2006   Qualifier: Diagnosis of  By: Julia Roberts      Past Surgical History:  Procedure Laterality Date   VAGINAL DELIVERY  2010    Current Medications: Outpatient Medications Prior to Visit  Medication Sig Dispense Refill   ferrous sulfate 325 (65 FE) MG tablet Take 325 mg by mouth daily with breakfast.     metoprolol tartrate (LOPRESSOR) 25 MG tablet Take 1 tablet (25 mg total) by mouth as needed. For palpitations (Patient not taking: Reported on 05/07/2021) 30 tablet 3   No facility-administered medications prior to visit.     Allergies:   Patient has no known allergies.   Social History   Socioeconomic History   Marital status: Single    Spouse name: Not on file   Number of children: 1   Years of education: some college   Highest education level: Not on file  Occupational History   Not on  file  Tobacco Use   Smoking status: Never   Smokeless tobacco: Never  Vaping Use   Vaping Use: Never used  Substance and Sexual Activity   Alcohol use: No   Drug use: No   Sexual activity: Yes    Birth control/protection: None  Other Topics Concern   Not on file  Social History Narrative   Not on file   Social Determinants of Health   Financial Resource Strain: Not on file  Food Insecurity: Not on file  Transportation Needs: Not on file  Physical Activity: Not on file  Stress: Not on file  Social Connections: Not on file    She is single.  She has a 74 year old daughter.  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  Family History:  The patient's family history includes Diabetes in her maternal aunt; Hypertension in her father; Migraines in her mother.  Her mother is 36 and has anemia, father is 33 and has sleep apnea and has suffered a stroke and has CAD.  She has 2 brothers ages 82 and 31 with a history of hypertension.  ROS General: Negative; No fevers, chills, or night sweats; morbid obesity HEENT: Negative; No changes in vision or hearing, sinus congestion, difficulty swallowing Pulmonary: Negative; No cough, wheezing, shortness of breath, hemoptysis Cardiovascular: palpitations, atypical chest pain GI: Negative; No nausea, vomiting, diarrhea, or abdominal pain GU: Negative; No dysuria, hematuria, or difficulty voiding Musculoskeletal: Negative; no myalgias, joint pain, or weakness Hematologic/Oncology: Negative; no easy bruising, bleeding Endocrine: Negative; no heat/cold intolerance; no diabetes Neuro: Negative; no changes in balance, headaches Skin: Negative; No rashes or skin lesions Psychiatric: Negative; No behavioral problems, depression Sleep:  snoring, daytime sleepiness, hypersomnolence; witnessed apnea, occasional  palpitations, frequent urination;  no bruxism, restless legs, hypnogognic hallucinations, no cataplexy Other comprehensive 14 point system review is negative.   PHYSICAL EXAM:   VS:  BP 118/72   Pulse 70   Ht 5' 7"  (1.702 m)   Wt 265 lb (120.2 kg)   SpO2 100%   BMI 41.50 kg/m     Repeat blood pressure by me was 120/70  Wt Readings from Last 3 Encounters:  05/07/21 265 lb (120.2 kg)  03/10/21 266 lb (120.7 kg)  06/20/20 266 lb (120.7 kg)     General: Alert, oriented, no distress.  Skin: normal turgor, no rashes, warm and dry HEENT: Normocephalic, atraumatic. Pupils equal round and reactive to light; sclera anicteric; extraocular muscles intact;  Nose without nasal septal hypertrophy Mouth/Parynx tongue pierced with stud; Mallinpatti scale 3/4 Neck: No JVD, no carotid bruits; normal carotid upstroke Lungs: clear to ausculatation and percussion; no wheezing or rales Chest wall: without tenderness to palpitation Heart: PMI not displaced, RRR, s1 s2 normal, 1/6 systolic murmur, no diastolic murmur, no rubs, gallops, thrills, or heaves Abdomen: soft, nontender; no hepatosplenomehaly, BS+; abdominal aorta nontender and not dilated by palpation. Back: no CVA tenderness Pulses 2+ Musculoskeletal: full range of motion, normal strength, no joint deformities Extremities: no clubbing cyanosis or edema, Homan's sign negative  Neurologic: grossly nonfocal; Cranial nerves grossly wnl Psychologic: Normal mood and affect   Studies/Labs Reviewed:   ECG (independently read by me): NSR at 74; PVCs, nonspecific T wave abnormality; normal intervals  EKG:  EKG is not  ordered today.  I personally reviewed the ECG from November 28, 2019 which showed sinus rhythm at 80 with PVC.  Recent Labs: BMP Latest Ref Rng & Units 11/27/2019 11/24/2018 09/10/2017  Glucose 70 - 99 mg/dL 98 92 113(H)  BUN 6 - 20 mg/dL 9 9 6   Creatinine 0.44 - 1.00 mg/dL 0.70 0.75 0.71  BUN/Creat Ratio 9 - 23 - 12 -  Sodium  135 - 145 mmol/L 138 137 136  Potassium 3.5 - 5.1 mmol/L 3.7 4.6 4.1  Chloride 98 - 111 mmol/L 108 101 106  CO2 22 - 32 mmol/L 23 22 23   Calcium 8.9 - 10.3 mg/dL 9.5 9.6 9.3     Hepatic Function Latest Ref Rng & Units 11/30/2019 11/24/2018 01/15/2015  Total Protein 6.0 - 8.5 g/dL 7.0 7.2 7.5  Albumin 3.8 - 4.8 g/dL  4.3 4.3 4.2  AST 0 - 40 IU/L 18 21 22   ALT 0 - 32 IU/L 16 18 20   Alk Phosphatase 39 - 117 IU/L 65 62 59  Total Bilirubin 0.0 - 1.2 mg/dL 0.4 0.5 0.7  Bilirubin, Direct 0.00 - 0.40 mg/dL 0.14 - -    CBC Latest Ref Rng & Units 11/27/2019 02/10/2019 01/10/2019  WBC 4.0 - 10.5 K/uL 4.2 3.4 3.6  Hemoglobin 12.0 - 15.0 g/dL 9.1(L) 11.6 8.8(L)  Hematocrit 36.0 - 46.0 % 33.9(L) 38.6 31.8(L)  Platelets 150 - 400 K/uL 234 304 279   Lab Results  Component Value Date   MCV 72.7 (L) 11/27/2019   MCV 75 (L) 02/10/2019   MCV 73 (L) 01/10/2019   No results Roberts for: TSH Lab Results  Component Value Date   HGBA1C 4.8 11/24/2018     BNP No results Roberts for: BNP  ProBNP No results Roberts for: PROBNP   Lipid Panel     Component Value Date/Time   CHOL 119 11/30/2019 0814   TRIG 43 11/30/2019 0814   HDL 51 11/30/2019 0814   CHOLHDL 2.3 11/30/2019 0814   LDLCALC 57 11/30/2019 0814   LABVLDL 11 11/30/2019 0814     RADIOLOGY: No results Roberts.   Additional studies/ records that were reviewed today include:  I reviewed the records of Dr. Gwenlyn Roberts.  I reviewed the patient's diagnostic polysomnogram as well as her CPAP/BiPAP titration study.  A download was obtained in the office today since initiation of therapy and was reviewed with the patient.  An Epworth Sleepiness Scale score was calculated.   ASSESSMENT:    1. OSA (obstructive sleep apnea)   2. Palpitations   3. PVC's (premature ventricular contractions)   4. Morbid obesity ([place])   5. Snoring   6. Excessive daytime sleepiness   7. Witnessed episode of apnea     PLAN:  Ms. Syd Manges is a 35 year-old  African-American female who has a longstanding history of morbid obesity and has experienced palpitations which can occur both during the day and during the evening..  She has been documented to have normal systolic function on echocardiography in March 2021 and did not have significant valvular heart disease.  She has  been documented to have PVCs.  She has significant symptoms of sleep apnea including snoring, nocturia at least 6 times per night, frequent awakenings, witnessed apnea, nonrestorative sleep as well as excessive daytime sleepiness.  She was Roberts to have sleep apnea on her diagnostic evaluation with the events being more prominent during REM sleep.  On her titration study she required high pressures on CPAP and therefore was transitioned to BiPAP therapy.  Unfortunately, she had issues with tolerability to the high pressures leading to reduced compliance with subsequent pickup of her BiPAP device.  When I last saw her, she was not a candidate for inspire technology due to her elevated BMI.  Although she would most likely do well with a customized oral appliance unfortunately several dentists who we tried to refer her to would not take Medicaid patients.  Presently, the patient continues to sleep very poorly.  She is significantly symptomatic with her untreated sleep apnea.  She would like to have a reevaluation.  Since it is over 6 months since her initial study she will need to start from the beginning.  I will try to schedule her for a split-night protocol try to expedite this if at all possible.  Following her titration study she will need  to be seen by me within 30 days so that I can see monitor her tolerability and compliance very closely to ensure that her machine does not get taken away.  Presently, her blood pressure is controlled.  She has a prescription for metoprolol tartrate 25 mg for her palpitations.  She is unaware of any sustained arrhythmia.  BMI today is 41.5 consistent with morbid  obesity.  She has not been successful with weight loss.  We discussed the importance of exercise and increased weight loss if at all possible.  I will see her in follow-up of her subsequent study further recommendations will be made at that time.   Medication Adjustments/Labs and Tests Ordered: Current medicines are reviewed at length with the patient today.  Concerns regarding medicines are outlined above.  Medication changes, Labs and Tests ordered today are listed in the Patient Instructions below. Patient Instructions  Medication Instructions:  Your physician recommends that you continue on your current medications as directed. Please refer to the Current Medication list given to you today.  *If you need a refill on your cardiac medications before your next appointment, please call your pharmacy*   Lab Work: None ordered.   If you have labs (blood work) drawn today and your tests are completely normal, you will receive your results only by: [place] (if you have MyChart) OR A paper copy in the mail If you have any lab test that is abnormal or we need to change your treatment, we will call you to review the results.   Testing/Procedures: Your physician has recommended that you have a sleep study. This test records several body functions during sleep, including: brain activity, eye movement, oxygen and carbon dioxide blood levels, heart rate and rhythm, breathing rate and rhythm, the flow of air through your mouth and nose, snoring, body muscle movements, and chest and belly movement.    Follow-Up: At [facility], you and your health needs are our priority.  As part of our continuing mission to provide you with exceptional heart care, we have created designated Provider Care Teams.  These Care Teams include your primary Cardiologist (physician) and Advanced Practice Providers (APPs -  Physician Assistants and Nurse Practitioners) who all work together to provide you with the  care you need, when you need it.  We recommend signing up for the patient portal called "MyChart".  Sign up information is provided on this After Visit Summary.  MyChart is used to connect with patients for Virtual Visits (Telemedicine).  Patients are able to view lab/test results, encounter notes, upcoming appointments, etc.  Non-urgent messages can be sent to your provider as well.   To learn more about what you can do with MyChart, go to [email or web address].    Your next appointment:   Follow up after sleep study.  The format for your next appointment:   In Person  Provider:   Shelva Majestic, MD     Signed, Julia Majestic, MD  05/07/2021 6:47 PM    [facility] [address], [place], [place], [place]  [address] Phone: [phone]

## 2021-05-07 NOTE — Patient Instructions (Signed)
Medication Instructions:  Your physician recommends that you continue on your current medications as directed. Please refer to the Current Medication list given to you today.  *If you need a refill on your cardiac medications before your next appointment, please call your pharmacy*   Lab Work: None ordered.   If you have labs (blood work) drawn today and your tests are completely normal, you will receive your results only by: [place] (if you have MyChart) OR A paper copy in the mail If you have any lab test that is abnormal or we need to change your treatment, we will call you to review the results.   Testing/Procedures: Your physician has recommended that you have a sleep study. This test records several body functions during sleep, including: brain activity, eye movement, oxygen and carbon dioxide blood levels, heart rate and rhythm, breathing rate and rhythm, the flow of air through your mouth and nose, snoring, body muscle movements, and chest and belly movement.    Follow-Up: At [facility], you and your health needs are our priority.  As part of our continuing mission to provide you with exceptional heart care, we have created designated Provider Care Teams.  These Care Teams include your primary Cardiologist (physician) and Advanced Practice Providers (APPs -  Physician Assistants and Nurse Practitioners) who all work together to provide you with the care you need, when you need it.  We recommend signing up for the patient portal called "MyChart".  Sign up information is provided on this After Visit Summary.  MyChart is used to connect with patients for Virtual Visits (Telemedicine).  Patients are able to view lab/test results, encounter notes, upcoming appointments, etc.  Non-urgent messages can be sent to your provider as well.   To learn more about what you can do with MyChart, go to [email or web address].    Your next appointment:   Follow up after sleep  study.  The format for your next appointment:   In Person  Provider:   Shelva Majestic, MD

## 2021-05-15 NOTE — Addendum Note (Signed)
Addended by: Hinton Dyer on: 05/15/2021 11:01 AM   Modules accepted: Orders

## 2021-07-25 ENCOUNTER — Telehealth: Payer: Self-pay | Admitting: Cardiovascular Disease

## 2021-07-25 NOTE — Telephone Encounter (Signed)
Pt is calling to follow up on when she can get her sleep study scheduled. 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 Please advise pt further [phone]

## 2021-07-28 ENCOUNTER — Telehealth: Payer: Self-pay | Admitting: *Deleted

## 2021-07-28 NOTE — Telephone Encounter (Signed)
Prior Authorization for sleep study sent to [employer] via web portal. Tracking Number [id number].

## 2021-07-28 NOTE — Telephone Encounter (Signed)
Spoke with patient informing her that we will not be able to get her a sleep study before November. The sleep lab is scheduling into December and she has to start the process all over from the beginning. She will have to have both a NSPG as well as titration study. She will call urgent care and get a contact name for me to send them a note in hopes to get her an extension.she will call me back  with this information. Once she does I will write a note letting them know that she cannot get this completed by November.

## 2021-07-28 NOTE — Telephone Encounter (Signed)
Attempted to call patient. Could not leave a voice mail due to mailbox being full. Will try again later.

## 2021-07-29 NOTE — Telephone Encounter (Signed)
Authorization received. Appointment scheduled for 08/20/21. Patient placed on cancellation list. She has been notified of the appointment. Auth # [id number]. Auth dates 07/28/21 to 10/04/21.

## 2021-08-10 ENCOUNTER — Ambulatory Visit (HOSPITAL_BASED_OUTPATIENT_CLINIC_OR_DEPARTMENT_OTHER): Payer: Medicaid Other | Attending: Cardiovascular Disease | Admitting: Cardiovascular Disease

## 2021-08-10 ENCOUNTER — Other Ambulatory Visit: Payer: Self-pay

## 2021-08-10 DIAGNOSIS — G4733 Obstructive sleep apnea (adult) (pediatric): Secondary | ICD-10-CM

## 2021-08-10 DIAGNOSIS — R0683 Snoring: Secondary | ICD-10-CM | POA: Diagnosis not present

## 2021-08-10 DIAGNOSIS — G4730 Sleep apnea, unspecified: Secondary | ICD-10-CM | POA: Diagnosis not present

## 2021-08-20 ENCOUNTER — Encounter (HOSPITAL_BASED_OUTPATIENT_CLINIC_OR_DEPARTMENT_OTHER): Payer: Medicaid Other | Admitting: Cardiovascular Disease

## 2021-08-25 ENCOUNTER — Encounter (HOSPITAL_BASED_OUTPATIENT_CLINIC_OR_DEPARTMENT_OTHER): Payer: Self-pay | Admitting: Cardiovascular Disease

## 2021-08-25 NOTE — Procedures (Addendum)
     Patient Name: Julia Roberts, Julia Roberts Study Date: 08/10/2021 Gender: Female D.O.B: Jun 04, 1986 Age (years): 35 Referring Provider: Lorretta Harp Height (inches): 70 Interpreting Physician: Shelva Majestic MD, ABSM Weight (lbs): 264 RPSGT: Gwenyth Allegra BMI: 41 MRN: [id number] Neck Size: 15.00  CLINICAL INFORMATION Sleep Study Type: NPSG  Indication for sleep study: Fatigue, Obesity, OSA  Epworth Sleepiness Score: 8  Most recent polysomnogram dated 01/14/2020 revealed an AHI of 6.4/h and RDI of 7.2/h. Most recent titration study dated 03/27/2020 revealed an AHI of 15.0/h.  SLEEP STUDY TECHNIQUE As per the AASM Manual for the Scoring of Sleep and Associated Events v2.3 (April 2016) with a hypopnea requiring 4% desaturations.  The channels recorded and monitored were frontal, central and occipital EEG, electrooculogram (EOG), submentalis EMG (chin), nasal and oral airflow, thoracic and abdominal wall motion, anterior tibialis EMG, snore microphone, electrocardiogram, and pulse oximetry.  MEDICATIONS ferrous sulfate 325 (65 FE) MG tablet metoprolol tartrate (LOPRESSOR) 25 MG tablet (Expired)  Medications self-administered by patient taken the night of the study : N/A  SLEEP ARCHITECTURE The study was initiated at 11:08:34 PM and ended at 5:11:34 AM.  Sleep onset time was 99.5 minutes and the sleep efficiency was 63.5%%. The total sleep time was 230.5 minutes.  Stage REM latency was 111.0 minutes.  The patient spent 12.6%% of the night in stage N1 sleep, 75.1%% in stage N2 sleep, 0.0%% in stage N3 and 12.4% in REM.  Alpha intrusion was absent.  Supine sleep was 41.05%.  RESPIRATORY PARAMETERS The overall apnea/hypopnea index (AHI) was 2.6 per hour. The respiratory disturbance index (RDI) was 3.6/h. There were 0 total apneas, including 0 obstructive, 0 central and 0 mixed apneas. There were 10 hypopneas and 4 RERAs.  The AHI during Stage REM sleep was 0.0 per hour.  AHI  while supine was 5.1 per hour.  The mean oxygen saturation was 96.7%. The minimum SpO2 during sleep was 92.0%.  Soft snoring was noted during this study.  CARDIAC DATA The 2 lead EKG demonstrated sinus rhythm. The mean heart rate was 61.7 beats per minute. Other EKG findings include: PVCs.  LEG MOVEMENT DATA The total PLMS were 0 with a resulting PLMS index of 0.0. Associated arousal with leg movement index was 0.0 .  IMPRESSIONS - No significant obstructive sleep apnea overall during this study (AHI  2.6/h; RDI 3.6/h); however, very mild sleep apnea was present with supine sleep (AHI 5.1/h) - The patient had no oxygen desaturation during the study (Min O2 92.0%) - The patient snored with soft snoring volume. - EKG findings include PVCs. - Clinically significant periodic limb movements did not occur during sleep. No significant associated arousals.  DIAGNOSIS - sleep apnea, unspecified (G47.30) - Snoring  RECOMMENDATIONS - At present, there is no indication for CPAP therapy. - Effort should be made to optimize nasala nd oropharyngeal patency. - Patient shjould be counseled to avoid supine sleep.  - Avoid alcohol, sedatives and other CNS depressants that may worsen sleep apnea and disrupt normal sleep architecture. - Sleep hygiene should be reviewed to assess factors that may improve sleep quality. - Weight management (BMI 41) and regular exercise should be initiated or continued if appropriate.  [Electronically signed] 08/25/2021 08:15 AM  Shelva Majestic MD, [facility], [place], American Board of Sleep Medicine   NPI: [id number] [place] PH: [phone]   FX: [phone] [place]

## 2021-08-27 ENCOUNTER — Telehealth: Payer: Self-pay | Admitting: *Deleted

## 2021-08-27 NOTE — Telephone Encounter (Signed)
-----   Message from Troy Sine, MD sent at 08/25/2021  8:22 AM EST ----- Julia Roberts, please notify pt the results.

## 2021-08-27 NOTE — Telephone Encounter (Signed)
Patient notified of [facility] results. She asked for a copy to be emailed to her so she can take it to her DOT provider. Copy emailed. Confirmed with patient she received the report.

## 2021-11-06 ENCOUNTER — Ambulatory Visit: Payer: Medicaid Other | Admitting: Nurse Practitioner

## 2021-12-16 ENCOUNTER — Ambulatory Visit: Payer: Medicaid Other | Admitting: Obstetrics & Gynecology

## 2022-01-13 IMAGING — CR DG CHEST 2V
2 series · 2 of 2 positions shown · non-contrast
Comparison: 09/10/2017

CLINICAL DATA: Chest pain.

EXAM:
CHEST - 2 VIEW

[chest pa]
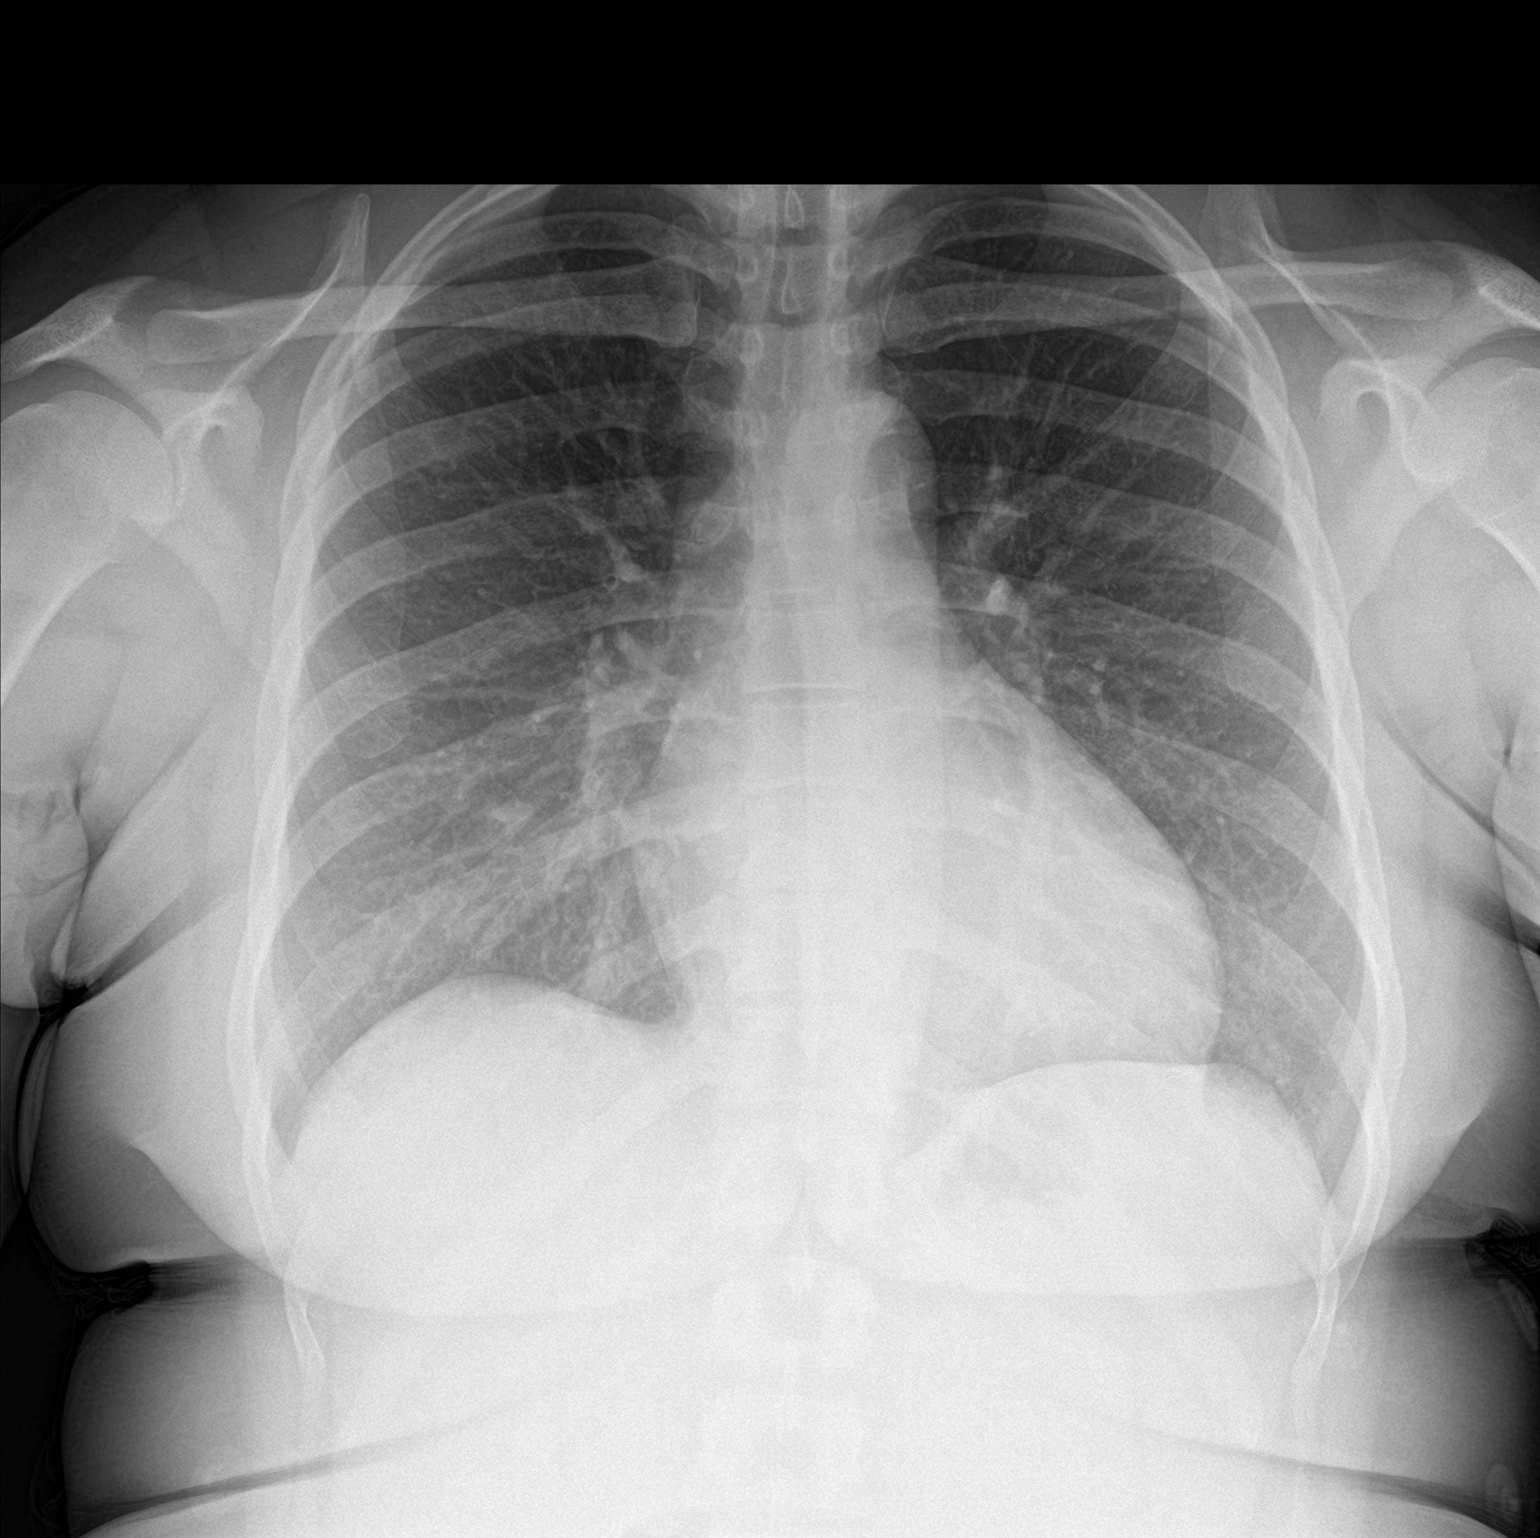

[chest lat]
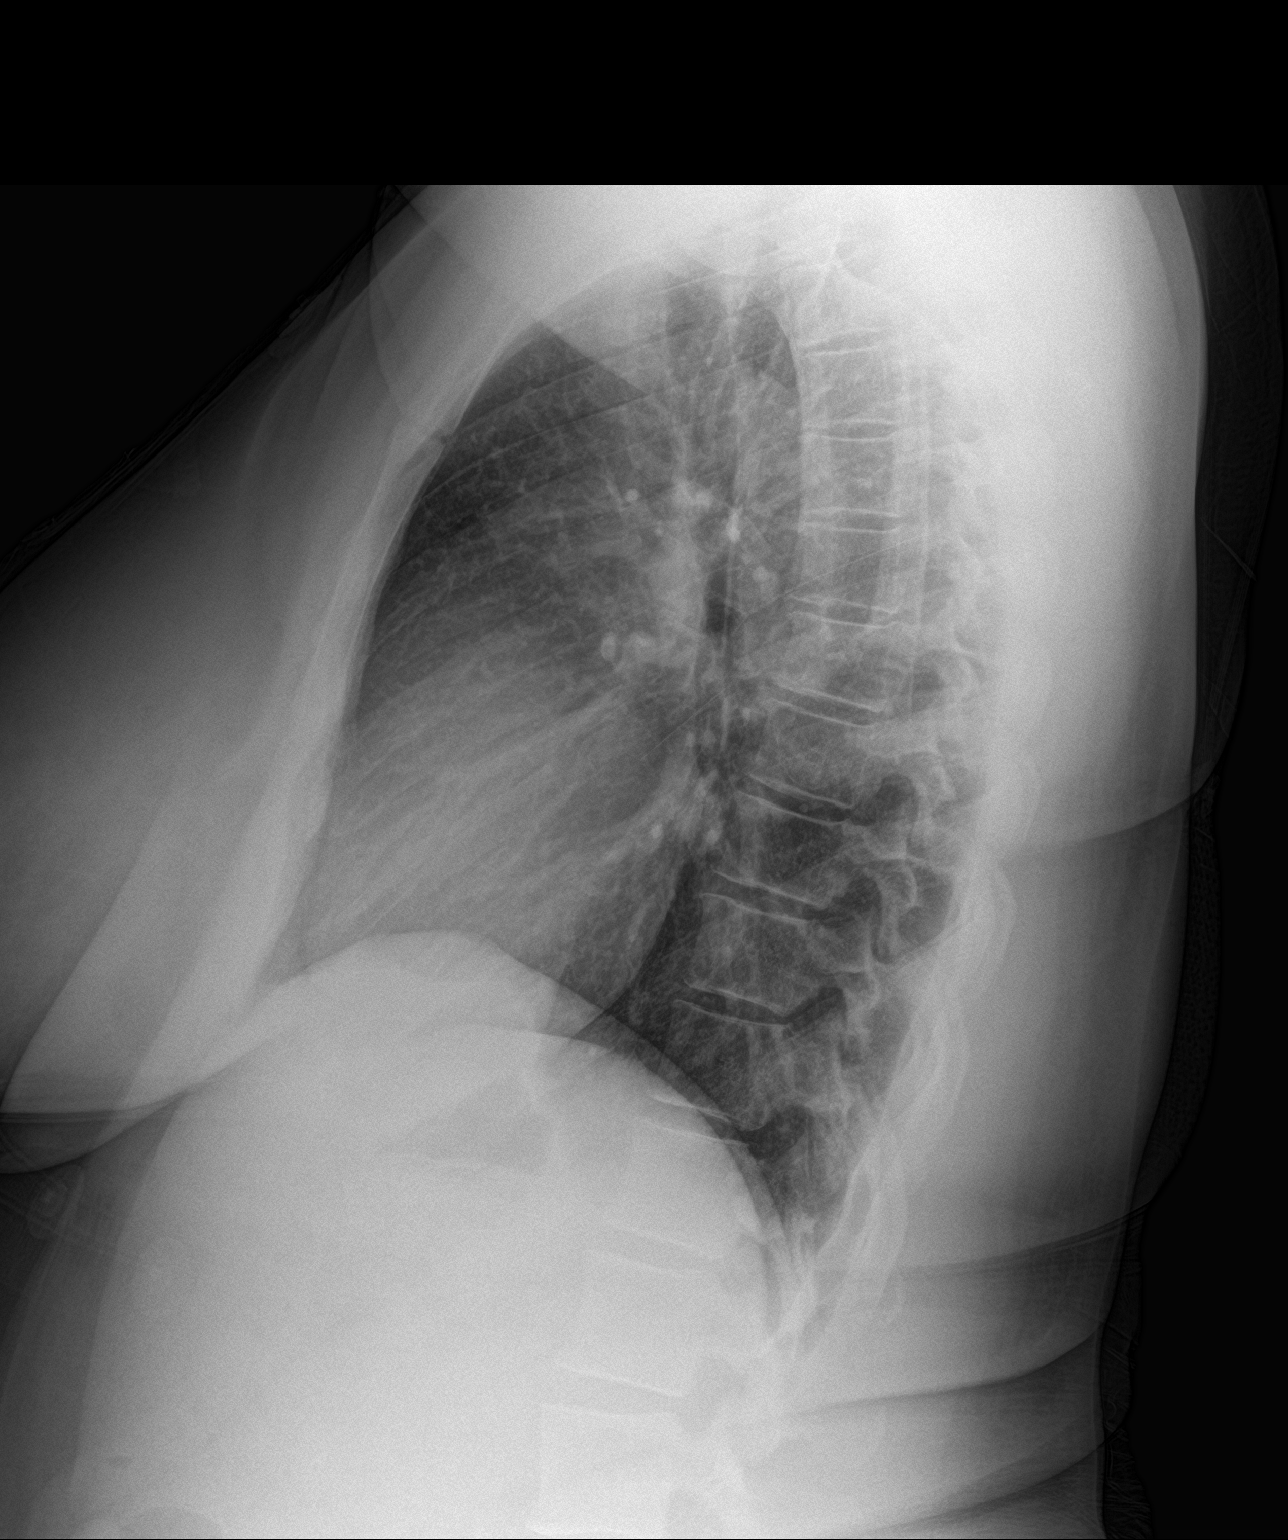

[2 of 2 positions shown; findings below may reference images not displayed]

FINDINGS: The heart size and mediastinal contours are within normal limits.
Both lungs are clear. The visualized skeletal structures are
unremarkable.
IMPRESSION: No acute cardiopulmonary findings.

## 2022-01-26 ENCOUNTER — Encounter: Payer: Medicaid Other | Admitting: Obstetrics and Gynecology

## 2022-01-27 ENCOUNTER — Encounter: Payer: Medicaid Other | Admitting: Obstetrics & Gynecology

## 2022-02-11 ENCOUNTER — Ambulatory Visit: Payer: Medicaid Other | Admitting: Nurse Practitioner

## 2022-03-16 ENCOUNTER — Telehealth: Payer: Self-pay | Admitting: Cardiovascular Disease

## 2022-03-16 NOTE — Telephone Encounter (Signed)
Pt sent this message Via My chart to our pt Scheduling pool:     Hi I am having some issues with my sleep apnea. I been waking up with headaches and I been having heart palpitations. I feel my pulse/heart beat non stop. I also been extremely tired and dizzy a lot lately. So I'm not really sure where to start but I do need to be seen very soon. My iron is also low. The pills I been taking my making me sick last few times I took it. Have not been able to keep my food down when taking my iron pill.

## 2022-03-16 NOTE — Telephone Encounter (Signed)
Recording "Call cannot be completed at this time."  

## 2022-03-17 NOTE — Telephone Encounter (Signed)
Called pt, unable to leave a message- voicemail is full.

## 2022-03-24 NOTE — Telephone Encounter (Signed)
LMTCB

## 2022-04-02 NOTE — Telephone Encounter (Signed)
Left message for pt to call.

## 2022-04-14 ENCOUNTER — Encounter: Payer: Medicaid Other | Admitting: Obstetrics & Gynecology

## 2022-04-30 ENCOUNTER — Telehealth: Payer: Self-pay | Admitting: Cardiovascular Disease

## 2022-04-30 NOTE — Telephone Encounter (Signed)
Patient c/o Palpitations:  High priority if patient c/o lightheadedness, shortness of breath, or chest pain  How long have you had palpitations/irregular HR/ Afib? Are you having the symptoms now? Since last night; Yes   Are you currently experiencing lightheadedness, SOB or CP? No   Do you have a history of afib (atrial fibrillation) or irregular heart rhythm? No   Have you checked your BP or HR? (document readings if available): no   Are you experiencing any other symptoms? Pre-syncope, dizziness, headache, tiredness

## 2022-04-30 NOTE — Telephone Encounter (Signed)
Left message for pt to call back  °

## 2022-05-01 NOTE — Telephone Encounter (Signed)
Spoke with pt, she was calling to get a follow up appointment. She reports palpitations esp at night when lying down. She also reports occ chest tightness and dizziness. Follow up scheduled with NP at the [place] office. Address given.

## 2022-05-05 ENCOUNTER — Encounter (HOSPITAL_BASED_OUTPATIENT_CLINIC_OR_DEPARTMENT_OTHER): Payer: Self-pay | Admitting: Family

## 2022-05-05 ENCOUNTER — Encounter (HOSPITAL_BASED_OUTPATIENT_CLINIC_OR_DEPARTMENT_OTHER): Payer: Self-pay

## 2022-05-05 ENCOUNTER — Ambulatory Visit (INDEPENDENT_AMBULATORY_CARE_PROVIDER_SITE_OTHER): Payer: Medicaid Other | Admitting: Family

## 2022-05-05 VITALS — BP 122/76 | HR 69 | Ht 67.0 in | Wt 253.4 lb

## 2022-05-05 DIAGNOSIS — I493 Ventricular premature depolarization: Secondary | ICD-10-CM

## 2022-05-05 DIAGNOSIS — D509 Iron deficiency anemia, unspecified: Secondary | ICD-10-CM | POA: Diagnosis not present

## 2022-05-05 DIAGNOSIS — R002 Palpitations: Secondary | ICD-10-CM

## 2022-05-05 DIAGNOSIS — Z6839 Body mass index (BMI) 39.0-39.9, adult: Secondary | ICD-10-CM

## 2022-05-05 DIAGNOSIS — R42 Dizziness and giddiness: Secondary | ICD-10-CM | POA: Diagnosis not present

## 2022-05-05 MED ORDER — METOPROLOL SUCCINATE ER 25 MG PO TB24: 25.0000 mg | ORAL_TABLET | Freq: Every day | ORAL | 5 refills | Status: DC

## 2022-05-05 NOTE — Patient Instructions (Signed)
Medication Instructions:  Your physician has recommended you make the following change in your medication:   Start: Metoprolol Succinate '25mg'$  daily   *If you need a refill on your cardiac medications before your next appointment, please call your pharmacy*   Lab Work: Your physician recommends that you return for lab work today- A1c, Thyroid Panel with TSH, Mag, CMP, CBC, Iron Panel   If you have labs (blood work) drawn today and your tests are completely normal, you will receive your results only by: [place] (if you have MyChart) OR A paper copy in the mail If you have any lab test that is abnormal or we need to change your treatment, we will call you to review the results.   Testing/Procedures: None ordered today    Follow-Up: At [facility], you and your health needs are our priority.  As part of our continuing mission to provide you with exceptional heart care, we have created designated Provider Care Teams.  These Care Teams include your primary Cardiologist (physician) and Advanced Practice Providers (APPs -  Physician Assistants and Nurse Practitioners) who all work together to provide you with the care you need, when you need it.  We recommend signing up for the patient portal called "MyChart".  Sign up information is provided on this After Visit Summary.  MyChart is used to connect with patients for Virtual Visits (Telemedicine).  Patients are able to view lab/test results, encounter notes, upcoming appointments, etc.  Non-urgent messages can be sent to your provider as well.   To learn more about what you can do with MyChart, go to [email or web address].    Your next appointment:   1 month(s)  The format for your next appointment:   In Person  Provider:   Quay Burow, MD  or Laurann Montana, NP {  Other Instructions We have referred you to the Healthy Weight & Wellness.   Heart Healthy Diet Recommendations: A low-salt diet is recommended. Meats should  be grilled, baked, or boiled. Avoid fried foods. Focus on lean protein sources like fish or chicken with vegetables and fruits. The American Heart Association is a Microbiologist!  American Heart Association Diet and Lifeystyle Recommendations   Exercise recommendations: The American Heart Association recommends 150 minutes of moderate intensity exercise weekly. Try 30 minutes of moderate intensity exercise 4-5 times per week. This could include walking, jogging, or swimming.   Important Information About Sugar

## 2022-05-05 NOTE — Progress Notes (Signed)
Office Visit    Patient Name: Julia Roberts Date of Encounter: 05/05/2022  PCP:  Ladell Pier, [place]  Cardiologist:  Quay Burow, MD / Dr. Claiborne Billings follows for sleep apnea Advanced Practice Provider:  No care team member to display Electrophysiologist:  None      Chief Complaint    [name] is a 36 y.o. female presents today for palpitations.    Past Medical History    Past Medical History:  Diagnosis Date   Anemia    Chlamydia    ECZEMA, ATOPIC DERMATITIS 12/02/2006   Qualifier: Diagnosis of  By: Herma Ard     Gonorrhea    Migraines    OBESITY, NOS 12/02/2006   Qualifier: Diagnosis of  By: Herma Ard     Past Surgical History:  Procedure Laterality Date   VAGINAL DELIVERY  2010    Allergies  No Known Allergies  History of Present Illness    Julia Roberts is a 36 y.o. female with a hx of obesity, palpitations last seen 05/07/2021 by Dr. Claiborne Billings for follow-up of sleep apnea.  Family history notable for CAD.  Echo 12/2019 LVEF 60 to 65%, no RWMA, no significant valvular normalities, normal diastolic function.  Monitor 12/14/2019 revealed probably normal sinus rhythm, frequent PVC, occasional Wenckebach.  She was set up for sleep study April 2021 noting sleep apnea and subsequently underwent titration study and was placed on BiPAP.  However BiPAP was returned to insurance company due to noncompliance. Repeat sleep study with very mild sleep apnea with supine sleep. No significant oxygen saturations. No recommendation for CPAP.   Presents today for follow up with her fiance and daughter. 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 Presently works for [employer] Notes feeling her heart rate beating through her body and can hear her heart beating in her ears. She has had to have family members help her with ambulation due to dizziness. Chest pain both at rest and with  activity. Wakes up with headaches. Does note dizziness with position changes. Drinks two 1 liter bottles of water per day. Eats 1-2 meals per day.  EKGs/Labs/Other Studies Reviewed:   The following studies were reviewed today:   EKG:  EKG is ordered today.  The ekg ordered today demonstrates NSR 69 bpm with occasional PVC  Recent Labs: No results found for requested labs within last 365 days.  Recent Lipid Panel    Component Value Date/Time   CHOL 119 11/30/2019 0814   TRIG 43 11/30/2019 0814   HDL 51 11/30/2019 0814   CHOLHDL 2.3 11/30/2019 0814   LDLCALC 57 11/30/2019 0814      Home Medications   Current Meds  Medication Sig   ferrous sulfate 325 (65 FE) MG tablet Take 325 mg by mouth daily with breakfast.     Review of Systems      All other systems reviewed and are otherwise negative except as noted above.  Physical Exam    VS:  BP 122/76 (BP Location: Right Arm, Patient Position: Sitting, Cuff Size: Large)   Pulse 69   Ht '5\' 7"'$  (1.702 m)   Wt 253 lb 6.4 oz (114.9 kg)   BMI 39.69 kg/m  , BMI Body mass index is 39.69 kg/m.  Wt Readings from Last 3 Encounters:  05/05/22 253 lb 6.4 oz (114.9 kg)  08/10/21 264 lb (119.7 kg)  05/07/21 265 lb (  120.2 kg)    GEN: Well nourished, overweight, well developed, in no acute distress. HEENT: normal. Neck: Supple, no JVD, carotid bruits, or masses. Cardiac: RRR, no murmurs, rubs, or gallops. No clubbing, cyanosis, edema.  Radials/PT 2+ and equal bilaterally.  Respiratory:  Respirations regular and unlabored, clear to auscultation bilaterally. GI: Soft, nontender, nondistended. MS: No deformity or atrophy. Skin: Warm and dry, no rash. Neuro:  Strength and sensation are intact. Psych: Normal affect.  Assessment & Plan    Chest pain -EKG today no acute ST/T wave changes.  Chest pain atypical as it occurs at rest.  Consider etiology musculoskeletal, anxiety, PVC.  If does not improve with metoprolol consider coronary  calcium score.  Obesity - Weight loss via diet and exercise encouraged. Discussed the impact being overweight would have on cardiovascular risk.  Congratulated on current weight loss.  Will refer to healthy weight and wellness clinic. A1c today to assess for diabetes.  PVC /palpitations -noted by EKG today.  Known prior monitor with frequent PVC.  Start Toprol 25 mg daily.  Not currently planning pregnancy and discussed to contact us if noted.  Update CBC, TSH, CMP, magnesium.  Iron deficiency anemia -tolerating only half of her iron tablet.  Overdue for CBC, iron studies which we will collect today.  Encouraged her to establish with primary care.  OSA -previous OSA with BiPAP however most recent sleep study November 2022 with no indication for CPAP.  We will send MyChart message regarding sleep hygiene.  Lightheadedness -notes lightheadedness and sensation of needing to sit down.  Notes with bending over and the picking up boxes at work.  Orthostatic vitals today negative in clinic. Education provided on orthostatic precautions: Stable hydrated, eat regular meals, wear compression socks, with position changes slowly. Echo 2021 normal LVEF, no significant valvular abnormalities. If does not improve with orthostatic precautions and   Orthostatic VS for the past 24 hrs (Last 3 readings):  BP- Lying Pulse- Lying BP- Sitting Pulse- Sitting BP- Standing at 0 minutes Pulse- Standing at 0 minutes BP- Standing at 3 minutes Pulse- Standing at 3 minutes  05/05/22 1435 108/66 62 110/69 81 114/71 69 103/65 79    Blurred vision - Notes this is constantwith items that are far away.  No amaurosis fugax.  No loss of vision.  Recommend she schedule an appoint with her eye doctor.         Disposition: Follow up in 1 month(s) with Quay Burow, MD or APP.  Signed, Loel Dubonnet, NP 05/05/2022, 2:58 PM [place]

## 2022-05-06 ENCOUNTER — Other Ambulatory Visit: Payer: Self-pay | Admitting: Student

## 2022-05-06 ENCOUNTER — Observation Stay (HOSPITAL_COMMUNITY)
Admission: EM | Admit: 2022-05-06 | Discharge: 2022-05-07 | Disposition: A | Discharge status: Home / Self Care | Payer: Medicaid Other | Attending: Emergency Medicine | Admitting: Emergency Medicine

## 2022-05-06 ENCOUNTER — Encounter (HOSPITAL_COMMUNITY): Payer: Self-pay | Admitting: Emergency Medicine

## 2022-05-06 ENCOUNTER — Other Ambulatory Visit: Payer: Self-pay

## 2022-05-06 DIAGNOSIS — D649 Anemia, unspecified: Principal | ICD-10-CM | POA: Insufficient documentation

## 2022-05-06 DIAGNOSIS — G4733 Obstructive sleep apnea (adult) (pediatric): Secondary | ICD-10-CM | POA: Diagnosis present

## 2022-05-06 DIAGNOSIS — N939 Abnormal uterine and vaginal bleeding, unspecified: Secondary | ICD-10-CM | POA: Diagnosis present

## 2022-05-06 DIAGNOSIS — N938 Other specified abnormal uterine and vaginal bleeding: Secondary | ICD-10-CM | POA: Diagnosis not present

## 2022-05-06 DIAGNOSIS — E6609 Other obesity due to excess calories: Secondary | ICD-10-CM

## 2022-05-06 DIAGNOSIS — Z79899 Other long term (current) drug therapy: Secondary | ICD-10-CM | POA: Insufficient documentation

## 2022-05-06 HISTORY — DX: Sleep apnea, unspecified: G47.30

## 2022-05-06 LAB — CBC WITH DIFFERENTIAL/PLATELET
Abs Immature Granulocytes: 0.01 10*3/uL (ref 0.00–0.07)
Basophils Absolute: 0 10*3/uL (ref 0.0–0.1)
Basophils Relative: 0 %
Eosinophils Absolute: 0 10*3/uL (ref 0.0–0.5)
Eosinophils Relative: 2 %
HCT: 20.2 % — ABNORMAL LOW (ref 36.0–46.0)
Hemoglobin: 5 g/dL — CL (ref 12.0–15.0)
Immature Granulocytes: 0 %
Lymphocytes Relative: 24 %
Lymphs Abs: 0.6 10*3/uL — ABNORMAL LOW (ref 0.7–4.0)
MCH: 17.1 pg — ABNORMAL LOW (ref 26.0–34.0)
MCHC: 24.8 g/dL — ABNORMAL LOW (ref 30.0–36.0)
MCV: 69.2 fL — ABNORMAL LOW (ref 80.0–100.0)
Monocytes Absolute: 0.2 10*3/uL (ref 0.1–1.0)
Monocytes Relative: 7 %
Neutro Abs: 1.8 10*3/uL (ref 1.7–7.7)
Neutrophils Relative %: 67 %
Platelets: 134 10*3/uL — ABNORMAL LOW (ref 150–400)
RBC: 2.92 MIL/uL — ABNORMAL LOW (ref 3.87–5.11)
RDW: 21.7 % — ABNORMAL HIGH (ref 11.5–15.5)
WBC: 2.6 10*3/uL — ABNORMAL LOW (ref 4.0–10.5)
nRBC: 0 % (ref 0.0–0.2)

## 2022-05-06 LAB — CBC
HCT: 21.3 % — ABNORMAL LOW (ref 36.0–46.0)
Hematocrit: 20 % — ABNORMAL LOW (ref 34.0–46.6)
Hemoglobin: 5.2 g/dL — CL (ref 11.1–15.9)
Hemoglobin: 5.3 g/dL — CL (ref 12.0–15.0)
MCH: 17 pg — ABNORMAL LOW (ref 26.6–33.0)
MCH: 17 pg — ABNORMAL LOW (ref 26.0–34.0)
MCHC: 26 g/dL — ABNORMAL LOW (ref 31.5–35.7)
MCHC: 24.9 g/dL — ABNORMAL LOW (ref 30.0–36.0)
MCV: 66 fL — ABNORMAL LOW (ref 79–97)
MCV: 68.3 fL — ABNORMAL LOW (ref 80.0–100.0)
Platelets: 209 10*3/uL (ref 150–450)
Platelets: 148 10*3/uL — ABNORMAL LOW (ref 150–400)
RBC: 3.05 x10E6/uL — ABNORMAL LOW (ref 3.77–5.28)
RBC: 3.12 MIL/uL — ABNORMAL LOW (ref 3.87–5.11)
RDW: 20.2 % — ABNORMAL HIGH (ref 11.7–15.4)
RDW: 22.3 % — ABNORMAL HIGH (ref 11.5–15.5)
WBC: 3 10*3/uL — ABNORMAL LOW (ref 3.4–10.8)
WBC: 3.2 10*3/uL — ABNORMAL LOW (ref 4.0–10.5)
nRBC: 0 % (ref 0.0–0.2)

## 2022-05-06 LAB — COMPREHENSIVE METABOLIC PANEL
ALT: 13 IU/L (ref 0–32)
ALT: 15 U/L (ref 0–44)
AST: 15 IU/L (ref 0–40)
AST: 21 U/L (ref 15–41)
Albumin/Globulin Ratio: 1.7 (ref 1.2–2.2)
Albumin: 4.3 g/dL (ref 3.9–4.9)
Albumin: 3.7 g/dL (ref 3.5–5.0)
Alkaline Phosphatase: 63 IU/L (ref 44–121)
Alkaline Phosphatase: 50 U/L (ref 38–126)
Anion gap: 5 (ref 5–15)
BUN/Creatinine Ratio: 11 (ref 9–23)
BUN: 8 mg/dL (ref 6–20)
BUN: 7 mg/dL (ref 6–20)
Bilirubin Total: 0.4 mg/dL (ref 0.0–1.2)
CO2: 21 mmol/L (ref 20–29)
CO2: 23 mmol/L (ref 22–32)
Calcium: 9.8 mg/dL (ref 8.7–10.2)
Calcium: 9.5 mg/dL (ref 8.9–10.3)
Chloride: 103 mmol/L (ref 96–106)
Chloride: 108 mmol/L (ref 98–111)
Creatinine, Ser: 0.71 mg/dL (ref 0.57–1.00)
Creatinine, Ser: 0.71 mg/dL (ref 0.44–1.00)
GFR, Estimated: >60 mL/min (ref >60)
Globulin, Total: 2.6 g/dL (ref 1.5–4.5)
Glucose, Bld: 98 mg/dL (ref 70–99)
Glucose: 89 mg/dL (ref 70–99)
Potassium: 4.4 mmol/L (ref 3.5–5.2)
Potassium: 4.1 mmol/L (ref 3.5–5.1)
Sodium: 136 mmol/L (ref 134–144)
Sodium: 136 mmol/L (ref 135–145)
Total Bilirubin: 0.5 mg/dL (ref 0.3–1.2)
Total Protein: 6.9 g/dL (ref 6.0–8.5)
Total Protein: 6.6 g/dL (ref 6.5–8.1)
eGFR: 114 mL/min/{1.73_m2} (ref >59)

## 2022-05-06 LAB — IRON,TIBC AND FERRITIN PANEL
Ferritin: 7 ng/mL — ABNORMAL LOW (ref 15–150)
Iron Saturation: 3 % — CL (ref 15–55)
Iron: 13 ug/dL — ABNORMAL LOW (ref 27–159)
Total Iron Binding Capacity: 420 ug/dL (ref 250–450)
UIBC: 407 ug/dL (ref 131–425)

## 2022-05-06 LAB — THYROID PANEL WITH TSH
Free Thyroxine Index: 1.5 (ref 1.2–4.9)
T3 Uptake Ratio: 31 % (ref 24–39)
T4, Total: 4.8 ug/dL (ref 4.5–12.0)
TSH: 1.07 u[IU]/mL (ref 0.450–4.500)

## 2022-05-06 LAB — FERRITIN: Ferritin: 3 ng/mL — ABNORMAL LOW (ref 11–307)

## 2022-05-06 LAB — I-STAT BETA HCG BLOOD, ED (MC, WL, AP ONLY): I-stat hCG, quantitative: <5 m[IU]/mL (ref <5)

## 2022-05-06 LAB — RETICULOCYTES
Immature Retic Fract: 13.5 % (ref 2.3–15.9)
RBC.: 2.92 MIL/uL — ABNORMAL LOW (ref 3.87–5.11)
Retic Count, Absolute: 47.6 10*3/uL (ref 19.0–186.0)
Retic Ct Pct: 1.6 % (ref 0.4–3.1)

## 2022-05-06 LAB — VITAMIN B12: Vitamin B-12: 253 pg/mL (ref 180–914)

## 2022-05-06 LAB — MAGNESIUM: Magnesium: 2 mg/dL (ref 1.6–2.3)

## 2022-05-06 LAB — IRON AND TIBC
Iron: 14 ug/dL — ABNORMAL LOW (ref 28–170)
Saturation Ratios: 3 % — ABNORMAL LOW (ref 10.4–31.8)
TIBC: 452 ug/dL — ABNORMAL HIGH (ref 250–450)
UIBC: 438 ug/dL

## 2022-05-06 LAB — PREPARE RBC (CROSSMATCH)

## 2022-05-06 LAB — FOLATE: Folate: 12.1 ng/mL (ref >5.9)

## 2022-05-06 LAB — TECHNOLOGIST SMEAR REVIEW

## 2022-05-06 LAB — ABO/RH: ABO/RH(D): A POS

## 2022-05-06 LAB — HEMOGLOBIN A1C
Est. average glucose Bld gHb Est-mCnc: 88 mg/dL
Hgb A1c MFr Bld: 4.7 % — ABNORMAL LOW (ref 4.8–5.6)

## 2022-05-06 MED ORDER — ACETAMINOPHEN 325 MG PO TABS: 650.0000 mg | ORAL_TABLET | Freq: Four times a day (QID) | ORAL | Status: DC | PRN

## 2022-05-06 MED ORDER — POLYETHYLENE GLYCOL 3350 17 G PO PACK: 17.0000 g | PACK | Freq: Every day | ORAL | Status: DC | PRN

## 2022-05-06 MED ORDER — ONDANSETRON HCL 4 MG/2ML IJ SOLN: 4.0000 mg | Freq: Four times a day (QID) | INTRAMUSCULAR | Status: DC | PRN

## 2022-05-06 MED ORDER — FERROUS GLUCONATE 324 (38 FE) MG PO TABS
324.0000 mg | ORAL_TABLET | Freq: Two times a day (BID) | ORAL | Status: DC
  Administered 2022-05-06 – 2022-05-07 (×3): 324 mg via ORAL
  Filled 2022-05-06 (×5): qty 1

## 2022-05-06 MED ORDER — SODIUM CHLORIDE 0.9 % IV SOLN: 10.0000 mL/h | Freq: Once | INTRAVENOUS | Status: DC

## 2022-05-06 MED ORDER — ONDANSETRON HCL 4 MG PO TABS: 4.0000 mg | ORAL_TABLET | Freq: Four times a day (QID) | ORAL | Status: DC | PRN

## 2022-05-06 MED ORDER — METOPROLOL SUCCINATE ER 25 MG PO TB24: 25.0000 mg | ORAL_TABLET | Freq: Every day | ORAL | Status: DC

## 2022-05-06 MED ORDER — BISACODYL 5 MG PO TBEC: 5.0000 mg | DELAYED_RELEASE_TABLET | Freq: Every day | ORAL | Status: DC | PRN

## 2022-05-06 MED ORDER — HYDRALAZINE HCL 20 MG/ML IJ SOLN: 5.0000 mg | INTRAMUSCULAR | Status: DC | PRN

## 2022-05-06 MED ORDER — ACETAMINOPHEN 650 MG RE SUPP: 650.0000 mg | Freq: Four times a day (QID) | RECTAL | Status: DC | PRN

## 2022-05-06 NOTE — H&P (Signed)
History and Physical    Patient: Julia Roberts FXT:[id number] DOB: Feb 13, 1986 DOA: 05/06/2022 DOS: the patient was seen and examined on 05/06/2022 PCP: Ladell Pier, MD  Patient coming from: Home - lives with her children and [place]; NOK: Doreen Salvage, [place]   Chief Complaint: Anemia  HPI: Julia Roberts is a 36 y.o. female with medical history significant of OSA and class 2 obesity presenting with anemia. She was seen by [facility] yesterday for chest pain/palpitations.  She reported lightheadedness and had routine lab work performed; Hgb was 5 and she was called to come to the ER.  She reports that she has heavy menses, last ended about a week ago and not currently bleeding.  During periods, which last 5-6 days, she soaks up to an overnight pad every 1.5 hours.  She has long-standing anemia for which she takes oral iron but never IV iron and never had a transfusion.  She recently started cutting her iron tabs in half due to side effects.  She is not sure whether she is completely done with fertility but maybe.  She does not have an OB/GYN.  She has been feeling lightheaded and fatigued but not really SOB.    ER Course:  Called by cardiology, Hgb 5.3.  Has been feeling some malaise, thought to be OSA.  Has h/o iron deficiency anemia from DUB.  Symptomatic - weak, some SOB, fatigued.  CBC confirms Hgb 5.3, pancytopenia.  Transfusing 2 units.  Smear, anemia panel pending.  Previously recommended to get IV iron but it is not clear if she has gotten that.     Review of Systems: As mentioned in the history of present illness. All other systems reviewed and are negative. Past Medical History:  Diagnosis Date   Anemia    Chlamydia    ECZEMA, ATOPIC DERMATITIS 12/02/2006   Qualifier: Diagnosis of  By: Herma Ard     Gonorrhea    Migraines    OBESITY, NOS 12/02/2006   Qualifier: Diagnosis of  By: Herma Ard     Sleep apnea    Past Surgical  History:  Procedure Laterality Date   VAGINAL DELIVERY  2010   Social History:  reports that she has never smoked. She has never used smokeless tobacco. She reports that she does not drink alcohol and does not use drugs.  No Known Allergies  Family History  Problem Relation Age of Onset   Migraines Mother    Hypertension Father    Diabetes Maternal Aunt     Prior to Admission medications   Medication Sig Start Date End Date Taking? Authorizing Provider  ferrous sulfate 325 (65 FE) MG tablet Take 325 mg by mouth daily with breakfast.    [provider]  metoprolol succinate (TOPROL XL) 25 MG 24 hr tablet Take 1 tablet (25 mg total) by mouth daily. 05/05/22   Loel Dubonnet, NP    Physical Exam: Vitals:   05/06/22 1229 05/06/22 1246 05/06/22 1329 05/06/22 1414  BP: (!) 106/56 106/74 (!) 109/53 107/74  Pulse: (!) 54 70 (!) 58 62  Resp: 20 14 (!) 22 14  Temp: 98.2 F (36.8 C) 99 F (37.2 C) 98.8 F (37.1 C) 98.8 F (37.1 C)  TempSrc: Oral  Oral Oral  SpO2: 100% 100% 100% 100%  Weight:      Height:       General:  Appears calm and comfortable and is in NAD Eyes:   EOMI, normal lids, iris ENT:  grossly normal hearing, lips & tongue, mmm Neck:  no LAD, masses or thyromegaly Cardiovascular:  RRR, no m/r/g. No LE edema.  Respiratory:   CTA bilaterally with no wheezes/rales/rhonchi.  Normal respiratory effort. Abdomen:  soft, NT, ND Skin:  no rash or induration seen on limited exam Musculoskeletal:  grossly normal tone BUE/BLE, good ROM, no bony abnormality Psychiatric:  grossly normal mood and affect, speech fluent and appropriate, AOx3 Neurologic:  CN 2-12 grossly intact, moves all extremities in coordinated fashion   Radiological Exams on Admission: Independently reviewed - see discussion in A/P where applicable  No results found.  EKG: not done   Labs on Admission: I have personally reviewed the available labs and imaging studies at the time of the  admission.  Pertinent labs:    Normal CMP Hgb 5.2, 5.3, 5.0 WBC 3, 3.2, 2.6 Platelets 209, 148, 134 A1c 4.7 Normal TSH Iron 13 Ferritin 7   Assessment and Plan: Principal Problem:   Symptomatic anemia Active Problems:   Class 2 obesity due to excess calories with body mass index (BMI) of 39.0 to 39.9 in adult   Obstructive sleep apnea   DUB (dysfunctional uterine bleeding)    Symptomatic anemia -Patient with long-standing DUB presenting with anemia (sent from cardiology office) -Hgb 5; 7-11 previously -MCV 66, RDW elevated -MCV is <80 and so this is microcytic anemia -MCV is < 70 indicating probable iron deficiency -With an MCV <70, elevated RDW, and reason for iron deficiency - iron deficiency can be presumed -With ferritin <15, diagnostic of iron deficiency -Will observe in med surg bed. -Transfuse 2 units PRBC to start and recheck CBC in AM -Patient counseled about short- and long-term risks associated with transfusion and consents to receive blood products.   DUB -Patient with heavy menses -She is open to consideration of options including OCPs, IUD, hysterectomy -TOC consult for outpatient GYN referral  OSA -No indication for CPAP on 08/2021 study  Obesity -Body mass index is 39.63 kg/m..  -Weight loss should be encouraged -Outpatient PCP/bariatric medicine/bariatric surgery f/u encouraged     Advance Care Planning:   Code Status: Full Code   Consults: TOC team  DVT Prophylaxis: Early ambulation  Family Communication: None present; she is capable of communicating with family at this time  Severity of Illness: The appropriate patient status for this patient is OBSERVATION. Observation status is judged to be reasonable and necessary in order to provide the required intensity of service to ensure the patient's safety. The patient's presenting symptoms, physical exam findings, and initial radiographic and laboratory data in the context of their medical  condition is felt to place them at decreased risk for further clinical deterioration. Furthermore, it is anticipated that the patient will be medically stable for discharge from the hospital within 2 midnights of admission.   Author: Karmen Bongo, MD 05/06/2022 3:47 PM  For on call review [email or web address].

## 2022-05-06 NOTE — Progress Notes (Signed)
I received a critical hemoglobin notification at 0120 today of 5.2 mg/dL.  I called Ms. Bowsher and instructed her to present to the nearest emergency department.  She did not answer and I left a voicemail.  I am routing to the ordering nurse practitioner.  Delton Prairie, MD Cardiology Fellow [facility]

## 2022-05-06 NOTE — ED Triage Notes (Signed)
Patient states she received a phone call at 0130 this morning telling her that her hemoglobin was low and she needed to go the ED. Patient states she had the bloodwork drawn as a routine check of her iron. Patient is alert, oriented, ambulatory, denies known source of bleeding.

## 2022-05-06 NOTE — ED Notes (Signed)
ED TO INPATIENT HANDOFF REPORT  ED Nurse Name and Phone #: Baxter Flattery, RN  S Name/Age/Gender Julia Roberts 36 y.o. female Room/Bed: 036C/036C  Code Status   Code Status: Full Code  Home/SNF/Other Home Patient oriented to: self, place, time, and situation Is this baseline? Yes   Triage Complete: Triage complete  Chief Complaint Symptomatic anemia [D64.9]  Triage Note Patient states she received a phone call at 0130 this morning telling her that her hemoglobin was low and she needed to go the ED. Patient states she had the bloodwork drawn as a routine check of her iron. Patient is alert, oriented, ambulatory, denies known source of bleeding.   Allergies No Known Allergies  Level of Care/Admitting Diagnosis ED Disposition     ED Disposition  Admit   Condition  --   Comment  Hospital Area: [facility] [[id number]]  Level of Care: Med-Surg [16]  May place patient in observation at [facility] or [facility] if equivalent level of care is available:: Yes  Covid Evaluation: Asymptomatic - no recent exposure (last 10 days) testing not required  Diagnosis: Symptomatic anemia [[id number]]  Admitting Physician: Karmen Bongo [[id number]]  Attending Physician: Karmen Bongo [[id number]]          B Medical/Surgery History Past Medical History:  Diagnosis Date   Anemia    Chlamydia    ECZEMA, ATOPIC DERMATITIS 12/02/2006   Qualifier: Diagnosis of  By: Herma Ard     Gonorrhea    Migraines    OBESITY, NOS 12/02/2006   Qualifier: Diagnosis of  By: Herma Ard     Sleep apnea    Past Surgical History:  Procedure Laterality Date   VAGINAL DELIVERY  2010     A IV Location/Drains/Wounds Patient Lines/Drains/Airways Status     Active Line/Drains/Airways     Name Placement date Placement time Site Days   Peripheral IV 05/06/22 20 G Anterior;Distal;Right;Upper Antecubital 05/06/22  0854  Antecubital  less than 1   Peripheral IV 05/06/22 20 G  Anterior;Left;Upper Antecubital 05/06/22  0854  Antecubital  less than 1            Intake/Output Last 24 hours  Intake/Output Summary (Last 24 hours) at 05/06/2022 1518 Last data filed at 05/06/2022 1228 Gross per 24 hour  Intake 315 ml  Output --  Net 315 ml    Labs/Imaging Results for orders placed or performed during the hospital encounter of 05/06/22 (from the past 48 hour(s))  Comprehensive metabolic panel     Status: None   Collection Time: 05/06/22  8:00 AM  Result Value Ref Range   Sodium 136 135 - 145 mmol/L   Potassium 4.1 3.5 - 5.1 mmol/L   Chloride 108 98 - 111 mmol/L   CO2 23 22 - 32 mmol/L   Glucose, Bld 98 70 - 99 mg/dL    Comment: Glucose reference range applies only to samples taken after fasting for at least 8 hours.   BUN 7 6 - 20 mg/dL   Creatinine, Ser 0.71 0.44 - 1.00 mg/dL   Calcium 9.5 8.9 - 10.3 mg/dL   Total Protein 6.6 6.5 - 8.1 g/dL   Albumin 3.7 3.5 - 5.0 g/dL   AST 21 15 - 41 U/L   ALT 15 0 - 44 U/L   Alkaline Phosphatase 50 38 - 126 U/L   Total Bilirubin 0.5 0.3 - 1.2 mg/dL   GFR, Estimated >60 >60 mL/min    Comment: (NOTE) Calculated using the CKD-EPI Creatinine  Equation (2021)    Anion gap 5 5 - 15    Comment: Performed at [facility], [place] [address], [place], [place] [address]  CBC     Status: Abnormal   Collection Time: 05/06/22  8:00 AM  Result Value Ref Range   WBC 3.2 (L) 4.0 - 10.5 K/uL   RBC 3.12 (L) 3.87 - 5.11 MIL/uL   Hemoglobin 5.3 (LL) 12.0 - 15.0 g/dL    Comment: REPEATED TO VERIFY Reticulocyte Hemoglobin testing may be clinically indicated, consider ordering this additional test [id number] THIS CRITICAL RESULT HAS VERIFIED AND BEEN CALLED TO BRITTANY BECK RN BY MELISSA BOOSTEDT ON 08 02 2023 AT 0818, AND HAS BEEN READ BACK.     HCT 21.3 (L) 36.0 - 46.0 %   MCV 68.3 (L) 80.0 - 100.0 fL   MCH 17.0 (L) 26.0 - 34.0 pg   MCHC 24.9 (L) 30.0 - 36.0 g/dL   RDW 22.3 (H) 11.5 - 15.5 %   Platelets 148 (L) 150 - 400  K/uL   nRBC 0.0 0.0 - 0.2 %    Comment: Performed at [facility] [address], [place], [place] [address]  Type and screen [place]     Status: None (Preliminary result)   Collection Time: 05/06/22  8:00 AM  Result Value Ref Range   ABO/RH(D) A POS    Antibody Screen NEG    Sample Expiration 05/09/2022,2359    Unit Number [id number]    Blood Component Type RED CELLS,LR    Unit division 00    Status of Unit ISSUED    Transfusion Status OK TO TRANSFUSE    Crossmatch Result Compatible    Unit Number [id number]    Blood Component Type RED CELLS,LR    Unit division 00    Status of Unit ISSUED    Transfusion Status OK TO TRANSFUSE    Crossmatch Result      Compatible Performed at [facility], [place] [address], [place], [place] [address]   I-Stat beta hCG blood, ED     Status: None   Collection Time: 05/06/22  8:09 AM  Result Value Ref Range   I-stat hCG, quantitative <5.0 <5 mIU/mL   Comment 3            Comment:   GEST. AGE      CONC.  (mIU/mL)   <=1 WEEK        5 - 50     2 WEEKS       50 - 500     3 WEEKS       100 - 10,000     4 WEEKS     1,000 - 30,000        FEMALE AND NON-PREGNANT FEMALE:     LESS THAN 5 mIU/mL   ABO/Rh     Status: None   Collection Time: 05/06/22  8:45 AM  Result Value Ref Range   ABO/RH(D)      A POS Performed at [facility], [place] [address], [place], [place] [address]   Prepare RBC (crossmatch)     Status: None   Collection Time: 05/06/22  8:48 AM  Result Value Ref Range   Order Confirmation      ORDER PROCESSED BY BLOOD BANK Performed at [facility], [place] [address], [place], [place] [address]   Technologist smear review     Status: None   Collection Time: 05/06/22  9:30 AM  Result Value Ref Range   WBC MORPHOLOGY MORPHOLOGY UNREMARKABLE    RBC MORPHOLOGY ELLIPTOCYTES    Plt Morphology MORPHOLOGY UNREMARKABLE    Clinical Information pancytopenia     Comment: Performed at [facility], [place] [address] [place], [place] [address]  Vitamin B12     Status: None   Collection Time: 05/06/22  9:30 AM  Result Value Ref Range   Vitamin B-12 253 180 - 914 pg/mL    Comment: (NOTE) This assay is not validated for testing neonatal or myeloproliferative syndrome specimens for Vitamin B12 levels. Performed at [facility], [place] [address], [place], [place] [address]   Folate     Status: None   Collection Time: 05/06/22  9:30 AM  Result Value Ref Range   Folate 12.1 >5.9 ng/mL    Comment: Performed at [facility] [address], [place], [place] [address]  Iron and TIBC     Status: Abnormal   Collection Time: 05/06/22  9:30 AM  Result Value Ref Range   Iron 14 (L) 28 - 170 ug/dL   TIBC 452 (H) 250 - 450 ug/dL   Saturation Ratios 3 (L) 10.4 - 31.8 %   UIBC 438 ug/dL    Comment: Performed at [facility], [place] [address], [place], [place] [address]  Ferritin     Status: Abnormal   Collection Time: 05/06/22  9:30 AM  Result Value Ref Range   Ferritin 3 (L) 11 - 307 ng/mL    Comment: Performed at [facility], [place] [address], [place], [place] [address]  Reticulocytes     Status: Abnormal   Collection Time: 05/06/22  9:30 AM  Result Value Ref Range   Retic Ct Pct 1.6 0.4 - 3.1 %   RBC. 2.92 (L) 3.87 - 5.11 MIL/uL   Retic Count, Absolute 47.6 19.0 - 186.0 K/uL   Immature Retic Fract 13.5 2.3 - 15.9 %    Comment: Performed at [facility] [address] [place], [place] [address]  CBC with Differential/Platelet     Status: Abnormal   Collection Time: 05/06/22  9:30 AM  Result Value Ref Range   WBC 2.6 (L) 4.0 - 10.5 K/uL   RBC 2.92 (L) 3.87 - 5.11 MIL/uL   Hemoglobin 5.0 (LL) 12.0 - 15.0 g/dL    Comment: CRITICAL VALUE NOTED.  VALUE IS CONSISTENT WITH PREVIOUSLY REPORTED AND CALLED VALUE. REPEATED TO VERIFY Reticulocyte Hemoglobin testing may be clinically indicated, consider ordering this additional test [id number]    HCT 20.2 (L)  36.0 - 46.0 %   MCV 69.2 (L) 80.0 - 100.0 fL   MCH 17.1 (L) 26.0 - 34.0 pg   MCHC 24.8 (L) 30.0 - 36.0 g/dL   RDW 21.7 (H) 11.5 - 15.5 %   Platelets 134 (L) 150 - 400 K/uL    Comment: REPEATED TO VERIFY   nRBC 0.0 0.0 - 0.2 %   Neutrophils Relative % 67 %   Neutro Abs 1.8 1.7 - 7.7 K/uL   Lymphocytes Relative 24 %   Lymphs Abs 0.6 (L) 0.7 - 4.0 K/uL   Monocytes Relative 7 %   Monocytes Absolute 0.2 0.1 - 1.0 K/uL   Eosinophils Relative 2 %   Eosinophils Absolute 0.0 0.0 - 0.5 K/uL   Basophils Relative 0 %   Basophils Absolute 0.0 0.0 - 0.1 K/uL   Immature Granulocytes 0 %   Abs Immature Granulocytes  0.01 0.00 - 0.07 K/uL   Ovalocytes PRESENT     Comment: Performed at [facility], [place] [address] [place], [place] [address]   No results found.  Pending Labs Unresulted Labs (From admission, onward)     Start     Ordered   05/06/22 1120  HIV Antibody (routine testing w rflx)  (HIV Antibody (Routine testing w reflex) panel)  Once,   R        05/06/22 1122            Vitals/Pain Today's Vitals   05/06/22 1229 05/06/22 1246 05/06/22 1329 05/06/22 1414  BP: (!) 106/56 106/74 (!) 109/53 107/74  Pulse: (!) 54 70 (!) 58 62  Resp: 20 14 (!) 22 14  Temp: 98.2 F (36.8 C) 99 F (37.2 C) 98.8 F (37.1 C) 98.8 F (37.1 C)  TempSrc: Oral  Oral Oral  SpO2: 100% 100% 100% 100%  Weight:      Height:      PainSc:        Isolation Precautions No active isolations  Medications Medications  0.9 %  sodium chloride infusion (0 mL/hr Intravenous Hold 05/06/22 1154)  ferrous gluconate (FERGON) tablet 324 mg (324 mg Oral Given 05/06/22 1256)  acetaminophen (TYLENOL) tablet 650 mg (has no administration in time range)    Or  acetaminophen (TYLENOL) suppository 650 mg (has no administration in time range)  polyethylene glycol (MIRALAX / GLYCOLAX) packet 17 g (has no administration in time range)  bisacodyl (DULCOLAX) EC tablet 5 mg (has no administration in time range)   ondansetron (ZOFRAN) tablet 4 mg (has no administration in time range)    Or  ondansetron (ZOFRAN) injection 4 mg (has no administration in time range)  hydrALAZINE (APRESOLINE) injection 5 mg (has no administration in time range)    Mobility walks Low fall risk   Focused Assessments Cardiac Assessment Handoff:    No results found for: "CKTOTAL", "CKMB", "CKMBINDEX", "TROPONINI" No results found for: "DDIMER" Does the Patient currently have chest pain? No    R Recommendations: See Admitting Provider Note  Report given to:   Additional Notes:

## 2022-05-06 NOTE — Addendum Note (Signed)
Addended by: Gerald Stabs on: 05/06/2022 01:03 PM   Modules accepted: Orders

## 2022-05-06 NOTE — ED Provider Notes (Signed)
[facility] EMERGENCY DEPARTMENT Provider Note   CSN: [id number] Arrival date & time: 05/06/22  4098     History  Chief Complaint  Patient presents with   Anemia    Julia Roberts is a 35 y.o. female with history of anemia and OSA who presents to the ED for low hemoglobin.  She reports that she went to her cardiologist office yesterday because she was feeling unwell for several weeks.  Symptoms include weakness, dizziness, blurred vision, shortness of breath, intermittent chest pain, and palpitations.  She thought the symptoms were due to poor sleep secondary to OSA.  Blood work at cardiologist office revealed a hemoglobin of less than 6.  She was advised to go to the ED overnight.  She works for [employer], mentions that she does some heavy lifting in the course of her job, but is looking for new jobs because recently she cannot tolerate the activity at work.  She has a history of anemia thought to be secondary to heavy menstrual bleeding.  She reports that she has been offered a hysterectomy in the past.  She thinks the anemia and heavy menstrual bleeding started after the birth of her daughter in 2010.  It is not unusual for her to go through several pads per hour when she is on her period.  Her last period was 2 weeks ago and was normal for her.  Right now she does not have chest pain, shortness of breath.  She reports some bruises on her back that tend to appear when her hemoglobin is low.  She reports a family history of heavy menstrual bleeding in her mother.  No family history of cancer.  She lives in [place] with her fianc.  She does not drink alcohol, use tobacco, or use drugs.  Family history: Heavy menstrual bleeding in her mother MI in late 41s in her father No cancers    Home Medications Prior to Admission medications   Medication Sig Start Date End Date Taking? Authorizing Provider  metoprolol succinate (TOPROL XL) 25 MG 24 hr tablet Take 1  tablet (25 mg total) by mouth daily. 05/05/22  Yes Loel Dubonnet, NP      Allergies    Patient has no known allergies.    Review of Systems   Review of Systems  Constitutional:  Positive for fatigue.  Eyes:        Blurred vision  Genitourinary:  Positive for frequency (Nocturia).  Skin:        Bruises on back  All other systems reviewed and are negative.   Physical Exam Updated Vital Signs BP (!) 109/53   Pulse (!) 58   Temp 98.8 F (37.1 C) (Oral)   Resp (!) 22   Ht '5\' 7"'$  (1.702 m)   Wt 114.8 kg   SpO2 100%   BMI 39.63 kg/m  Physical Exam Vitals and nursing note reviewed.  Constitutional:      General: She is not in acute distress.    Appearance: She is well-developed.  HENT:     Head: Normocephalic and atraumatic.  Eyes:     Comments: Pale conjunctiva  Neck:     Comments: No JVD Cardiovascular:     Rate and Rhythm: Normal rate and regular rhythm.  Pulmonary:     Effort: Pulmonary effort is normal. No respiratory distress.     Breath sounds: Normal breath sounds.  Abdominal:     Palpations: Abdomen is soft.  Tenderness: There is no abdominal tenderness.  Musculoskeletal:        General: No swelling.     Cervical back: Neck supple.  Skin:    General: Skin is warm and dry.     Capillary Refill: Capillary refill takes less than 2 seconds.  Neurological:     Mental Status: She is alert.  Psychiatric:        Mood and Affect: Mood normal.     ED Results / Procedures / Treatments   Labs (all labs ordered are listed, but only abnormal results are displayed) Labs Reviewed  CBC - Abnormal; Notable for the following components:      Result Value   WBC 3.2 (*)    RBC 3.12 (*)    Hemoglobin 5.3 (*)    HCT 21.3 (*)    MCV 68.3 (*)    MCH 17.0 (*)    MCHC 24.9 (*)    RDW 22.3 (*)    Platelets 148 (*)    All other components within normal limits  IRON AND TIBC - Abnormal; Notable for the following components:   Iron 14 (*)    TIBC 452 (*)     Saturation Ratios 3 (*)    All other components within normal limits  FERRITIN - Abnormal; Notable for the following components:   Ferritin 3 (*)    All other components within normal limits  RETICULOCYTES - Abnormal; Notable for the following components:   RBC. 2.92 (*)    All other components within normal limits  CBC WITH DIFFERENTIAL/PLATELET - Abnormal; Notable for the following components:   WBC 2.6 (*)    RBC 2.92 (*)    Hemoglobin 5.0 (*)    HCT 20.2 (*)    MCV 69.2 (*)    MCH 17.1 (*)    MCHC 24.8 (*)    RDW 21.7 (*)    Platelets 134 (*)    Lymphs Abs 0.6 (*)    All other components within normal limits  COMPREHENSIVE METABOLIC PANEL  TECHNOLOGIST SMEAR REVIEW  VITAMIN B12  FOLATE  HIV ANTIBODY (ROUTINE TESTING W REFLEX)  I-STAT BETA HCG BLOOD, ED ([facility], [facility], [facility] ONLY)  TYPE AND SCREEN  ABO/RH  PREPARE RBC (CROSSMATCH)    EKG None  Radiology No results found.  Medications Ordered in ED Medications  0.9 %  sodium chloride infusion (0 mL/hr Intravenous Hold 05/06/22 1154)  ferrous gluconate (FERGON) tablet 324 mg (324 mg Oral Given 05/06/22 1256)  acetaminophen (TYLENOL) tablet 650 mg (has no administration in time range)    Or  acetaminophen (TYLENOL) suppository 650 mg (has no administration in time range)  polyethylene glycol (MIRALAX / GLYCOLAX) packet 17 g (has no administration in time range)  bisacodyl (DULCOLAX) EC tablet 5 mg (has no administration in time range)  ondansetron (ZOFRAN) tablet 4 mg (has no administration in time range)    Or  ondansetron (ZOFRAN) injection 4 mg (has no administration in time range)  hydrALAZINE (APRESOLINE) injection 5 mg (has no administration in time range)    ED Course/ Medical Decision Making/ A&P Clinical Course as of 05/06/22 1341  Wed May 06, 2022  0834 Hemoglobin(!!): 5.3 [MM]  0834 WBC(!): 3.2 [MM]  0834 Platelets(!): 148 [MM]  0840 Hemodynamically stable.  Exam notable for pale conjunctiva and mucous  membranes.  1+ pretibial pitting edema. [MM]  0856 Anemia Labs ordered.  Requesting technologist smear review.  2 units PRBCs ordered.  Will page hospitalist for admission. [MM]  0916 Previous records reviewed: -Normal echo from 2021 -Continuous cardiac monitoring from 2021 notable for frequent PVCs [MM]  1016 Discussed case with hospitalist Karmen Bongo who will evaluate the patient for admission to the hospital. [MM]  1040 Blood smear review without schistocytes. [MM]    Clinical Course User Index [MM] Nani Gasser, MD                           Medical Decision Making Amount and/or Complexity of Data Reviewed Labs: ordered. Decision-making details documented in ED Course.  Risk Prescription drug management. Decision regarding hospitalization.   This patient presents to the ED for concern of severe anemia, this involves an extensive number of treatment options, and is a complaint that carries with it a high risk of complications and morbidity.  The differential diagnosis includes iron deficiency anemia due to blood loss, anemia due to myelosuppression, DIC of uncertain etiology.   Co morbidities that complicate the patient evaluation  OSA, not on BiPAP History of heavy menstrual bleeding   Social Determinants of Health:  Lives in [place] with her fianc.  Works for [employer].  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  Does not use alcohol, tobacco, or drugs.   Additional history obtained:  Additional history and/or information obtained from fianc. External records from outside source obtained and reviewed including records from cardiologist and PCP.   Lab Tests:  I Ordered (or co-signed), and personally interpreted labs.  The pertinent results include: Severe iron deficiency anemia - CBC: Pancytopenia - Ferritin: 3 - Iron: 14 - TIBC: 452 - Saturation: 3% - Vitamin B12: 253 - Folate: 12.1  Imaging Studies ordered:  I do not  suspect acute internal hemorrhaging could account for this patient's iron deficiency anemia.  I did not order imaging studies.   Cardiac Monitoring:  The patient was maintained on a cardiac monitor.  The cardiac monitored showed an rhythm of normal sinus rhythm. The patient was also maintained on pulse oximetry. The readings were typically within normal limits.   Medicines ordered and prescription drug management:  I ordered medication including blood transfusion Reevaluation of the patient after these medicines showed that the patient improved  Reevaluation:  After the interventions noted above, I reevaluated the patient and found that they have :improved.  Dispostion:  After consideration of the diagnostic results and the patients response to treatment, I feel that the patent would benefit from admission for work-up and treatment of iron deficiency anemia.          Final Clinical Impression(s) / ED Diagnoses Final diagnoses:  Symptomatic anemia    Rx / DC Orders ED Discharge Orders     None         Nani Gasser, MD 05/06/22 1342    Carmin Muskrat, MD 05/06/22 [phone]

## 2022-05-07 DIAGNOSIS — D649 Anemia, unspecified: Secondary | ICD-10-CM | POA: Diagnosis not present

## 2022-05-07 DIAGNOSIS — Z6839 Body mass index (BMI) 39.0-39.9, adult: Secondary | ICD-10-CM

## 2022-05-07 DIAGNOSIS — E6609 Other obesity due to excess calories: Secondary | ICD-10-CM

## 2022-05-07 DIAGNOSIS — N938 Other specified abnormal uterine and vaginal bleeding: Secondary | ICD-10-CM

## 2022-05-07 LAB — CBC
HCT: 24.9 % — ABNORMAL LOW (ref 36.0–46.0)
Hemoglobin: 6.6 g/dL — CL (ref 12.0–15.0)
MCH: 18.4 pg — ABNORMAL LOW (ref 26.0–34.0)
MCHC: 26.5 g/dL — ABNORMAL LOW (ref 30.0–36.0)
MCV: 69.6 fL — ABNORMAL LOW (ref 80.0–100.0)
Platelets: 123 10*3/uL — ABNORMAL LOW (ref 150–400)
RBC: 3.58 MIL/uL — ABNORMAL LOW (ref 3.87–5.11)
RDW: 22 % — ABNORMAL HIGH (ref 11.5–15.5)
WBC: 3.8 10*3/uL — ABNORMAL LOW (ref 4.0–10.5)
nRBC: 0 % (ref 0.0–0.2)

## 2022-05-07 LAB — HEMOGLOBIN AND HEMATOCRIT, BLOOD
HCT: 31 % — ABNORMAL LOW (ref 36.0–46.0)
Hemoglobin: 8.4 g/dL — ABNORMAL LOW (ref 12.0–15.0)

## 2022-05-07 LAB — HIV ANTIBODY (ROUTINE TESTING W REFLEX): HIV Screen 4th Generation wRfx: NONREACTIVE

## 2022-05-07 LAB — PREPARE RBC (CROSSMATCH)

## 2022-05-07 MED ORDER — NA FERRIC GLUC CPLX IN SUCROSE 12.5 MG/ML IV SOLN
250.0000 mg | Freq: Once | INTRAVENOUS | Status: AC
  Administered 2022-05-07: 250 mg via INTRAVENOUS
  Filled 2022-05-07: qty 20

## 2022-05-07 MED ORDER — SODIUM CHLORIDE 0.9% IV SOLUTION
Freq: Once | INTRAVENOUS | Status: AC
  Administered 2022-05-07: 1000 mL via INTRAVENOUS

## 2022-05-07 MED ORDER — FERROUS GLUCONATE 324 (38 FE) MG PO TABS: 324.0000 mg | ORAL_TABLET | Freq: Two times a day (BID) | ORAL | 0 refills | Status: DC

## 2022-05-07 NOTE — Progress Notes (Signed)
Blood transfusion started. [employer] Theme park manager, RN

## 2022-05-07 NOTE — Plan of Care (Signed)

## 2022-05-07 NOTE — Discharge Summary (Signed)
Physician Discharge Summary   Patient: Julia Roberts MRN: [id number] DOB: [birth year]  Admit date:     05/06/2022  Discharge date: 05/07/22  Discharge Physician: Patrecia Pour   PCP: Ladell Pier, MD   Recommendations at discharge:  Follow up with OB/GYN 05/12/2022 at 3:55pm for work up and management of menorrhagia.  Recheck CBC at follow up.  Discharge Diagnoses: Principal Problem:   Symptomatic anemia Active Problems:   Class 2 obesity due to excess calories with body mass index (BMI) of 39.0 to 39.9 in adult   Obstructive sleep apnea   DUB (dysfunctional uterine bleeding)  Hospital Course: Per H&P by Dr. Lorin Mercy:  HPI: Julia Roberts is a 36 y.o. female with medical history significant of OSA and class 2 obesity presenting with anemia. She was seen by [facility] yesterday for chest pain/palpitations.  She reported lightheadedness and had routine lab work performed; Hgb was 5 and she was called to come to the ER.  She reports that she has heavy menses, last ended about a week ago and not currently bleeding.  During periods, which last 5-6 days, she soaks up to an overnight pad every 1.5 hours.  She has long-standing anemia for which she takes oral iron but never IV iron and never had a transfusion.  She recently started cutting her iron tabs in half due to side effects.  She is not sure whether she is completely done with fertility but maybe.  She does not have an OB/GYN.  She has been feeling lightheaded and fatigued but not really SOB.   ER Course:  Called by cardiology, Hgb 5.3.  Has been feeling some malaise, thought to be OSA.  Has h/o iron deficiency anemia from DUB.  Symptomatic - weak, some SOB, fatigued.  CBC confirms Hgb 5.3, pancytopenia.  Transfusing 2 units.  Smear, anemia panel pending.  Previously recommended to get IV iron but it is not clear if she has gotten that.  Assessment and Plan: Symptomatic iron deficiency and chronic blood loss anemia due to  menorrhagia:  - Hgb up as anticipated s/p 3u PRBCs, Hgb 8.4g/dl, symptoms resolved.  - Also given IV iron prior to discharge, prescribed oral iron BID. Ferritin is 3, with TIBC 452, 3% sat, iron 14)  Menorrhagia: States roughly a pad every hour during the first 2-3 days of her ~6 day menstrual cycle that is regular. She's not ever taken anything for this, not on OCPs.  - Patient has follow up arranged with OB/GYN within the next week. Will defer work up to them at this time.   Thrombocytopenia: Also had mild low platelet count noted in 2010.  - If this persists despite improvement in anemia, would need further evaluation.   Leukopenia: Mild, chronically low-normal. No evidence of infection at this time.   Obesity: Body mass index is 38.6 kg/m.  - Weight loss recommended.   OSA: Not rising to the level that CPAP is needed per study Nov 2022.   Consultants: None Procedures performed: None  Disposition: Home Diet recommendation:  Cardiac diet DISCHARGE MEDICATION: Allergies as of 05/07/2022   No Known Allergies      Medication List     TAKE these medications    ferrous gluconate 324 MG tablet Commonly known as: FERGON Take 1 tablet (324 mg total) by mouth 2 (two) times daily with a meal.   metoprolol succinate 25 MG 24 hr tablet Commonly known as: Toprol XL Take 1 tablet (25 mg total) by  mouth daily.        Follow-up Information     Leftwich-Kirby, Kathie Dike, CNM. Schedule an appointment as soon as possible for a visit.   Specialty: Obstetrics and Gynecology Contact information: [place] [address] [phone]         Ladell Pier, MD Follow up.   Specialty: Internal Medicine Contact information: [address] [place] [address] [place] [address] [phone]                Discharge Exam: Danley Danker Weights   05/06/22 0843 05/06/22 1647  Weight: 114.8 kg 111.8 kg  BP 100/72   Pulse 63   Temp 98.3 F (36.8 C) (Oral)    Resp 18   Ht '5\' 7"'$  (1.702 m)   Wt 111.8 kg   SpO2 100%   BMI 38.60 kg/m   Well-appearing, stating she feels completely better.  Clear, nonlabored RRR no murmur or edema Soft, NT, ND No bleeding bruising  Condition at discharge: stable  The results of significant diagnostics from this hospitalization (including imaging, microbiology, ancillary and laboratory) are listed below for reference.   Imaging Studies: No results found.  Microbiology: Results for orders placed or performed during the hospital encounter of 03/25/20  SARS CORONAVIRUS 2 (TAT 6-24 HRS) Nasopharyngeal Nasopharyngeal Swab     Status: None   Collection Time: 03/25/20  1:35 PM   Specimen: Nasopharyngeal Swab  Result Value Ref Range Status   SARS Coronavirus 2 NEGATIVE NEGATIVE Final    Comment: (NOTE) SARS-CoV-2 target nucleic acids are NOT DETECTED.  The SARS-CoV-2 RNA is generally detectable in upper and lower respiratory specimens during the acute phase of infection. Negative results do not preclude SARS-CoV-2 infection, do not rule out co-infections with other pathogens, and should not be used as the sole basis for treatment or other patient management decisions. Negative results must be combined with clinical observations, patient history, and epidemiological information. The expected result is Negative.  Fact Sheet for Patients: SugarRoll.be  Fact Sheet for Healthcare Providers: https://www.woods-mathews.com/  This test is not yet approved or cleared by the [place] FDA and  has been authorized for detection and/or diagnosis of SARS-CoV-2 by FDA under an Emergency Use Authorization (EUA). This EUA will remain  in effect (meaning this test can be used) for the duration of the COVID-19 declaration under Se ction 564(b)(1) of the Act, 21 U.S.C. section 360bbb-3(b)(1), unless the authorization is terminated or revoked sooner.  Performed at [facility], [place] [address], [place], [place] [address]     Labs: CBC: Recent Labs  Lab 05/05/22 1536 05/06/22 0800 05/06/22 0930 05/07/22 0541 05/07/22 1039  WBC 3.0* 3.2* 2.6* 3.8*  --   NEUTROABS  --   --  1.8  --   --   HGB 5.2* 5.3* 5.0* 6.6* 8.4*  HCT 20.0* 21.3* 20.2* 24.9* 31.0*  MCV 66* 68.3* 69.2* 69.6*  --   PLT 209 148* 134* 123*  --    Basic Metabolic Panel: Recent Labs  Lab 05/05/22 1536 05/06/22 0800  NA 136 136  K 4.4 4.1  CL 103 108  CO2 21 23  GLUCOSE 89 98  BUN 8 7  CREATININE 0.71 0.71  CALCIUM 9.8 9.5  MG 2.0  --    Liver Function Tests: Recent Labs  Lab 05/05/22 1536 05/06/22 0800  AST 15 21  ALT 13 15  ALKPHOS 63 50  BILITOT 0.4 0.5  PROT 6.9 6.6  ALBUMIN 4.3 3.7   CBG: No results for input(s): "GLUCAP" in the last 168 hours.  Discharge time spent: greater than 30 minutes.  Signed: Patrecia Pour, MD [facility] 05/07/2022

## 2022-05-07 NOTE — TOC Initial Note (Addendum)
Transition of Care [facility]) - Initial/Assessment Note    Patient Details  Name: Julia Roberts MRN: [id number] Date of Birth: 08-Jan-1986  [facility] of [facility] CM/SW Contact:    Curlene Labrum, RN Phone Number: 05/07/2022, 11:31 AM  Clinical Narrative:                 CM met with the patient at the bedside to discuss TOC need for follow up visit with OB-Gyn.  The patient states that she is currently active patient with Julia Roberts at [phone].  I called the office and scheduled a followup appointment with the clinic at [facility] office on Tuesday, 05/12/2022 at 3:55 pm.  This is noted in the discharge instructions.  I schedule a PCP follow up at [facility] on Tuesday, September 01, 2022 at 0850.  She was placed on a waiting list to call her at home once a more recent appointment becomes available.  The patient's fiance will be providing transportation to home by car.  Expected Discharge Plan: Home/Self Care Barriers to Discharge: No Barriers Identified   Patient Goals and CMS Choice Patient states their goals for this hospitalization and ongoing recovery are:: Patient wants to get better and go home. CMS Medicare.gov Compare Post Acute Care list provided to:: Patient Choice offered to / list presented to : Patient  Expected Discharge Plan and Services Expected Discharge Plan: Home/Self Care In-house Referral: PCP / [facility] (Follow up OB/Gyn appointment scheduled.) Discharge Planning Services: CM Consult, Follow-up appt scheduled   Living arrangements for the past 2 months: Single Family Home Expected Discharge Date: 05/07/22                                    Prior Living Arrangements/Services Living arrangements for the past 2 months: Single Family Home Lives with:: Significant Other Patient language and need for interpreter reviewed:: Yes Do you feel safe going back to the place where you live?: Yes      Need for  Family Participation in Patient Care: Yes (Comment) Care giver support system in place?: Yes (comment)   Criminal Activity/Legal Involvement Pertinent to Current Situation/Hospitalization: No - Comment as needed  Activities of Daily Living Home Assistive Devices/Equipment: None ADL Screening (condition at time of admission) Patient's cognitive ability adequate to safely complete daily activities?: Yes Is the patient deaf or have difficulty hearing?: No Does the patient have difficulty seeing, even when wearing glasses/contacts?: No Does the patient have difficulty concentrating, remembering, or making decisions?: No Patient able to express need for assistance with ADLs?: Yes Does the patient have difficulty dressing or bathing?: No Independently performs ADLs?: Yes (appropriate for developmental age) Does the patient have difficulty walking or climbing stairs?: No Weakness of Legs: None Weakness of Arms/Hands: None  Permission Sought/Granted Permission sought to share information with : Case Manager, Family Supports, PCP       Permission granted to share info w AGENCY: [facility] [phone]  Permission granted to share info w Relationship: Fiance - [name] - [phone]     Emotional Assessment Appearance:: Appears stated age Attitude/Demeanor/Rapport: Gracious Affect (typically observed): Accepting Orientation: : Oriented to Self, Oriented to Place, Oriented to  Time, Oriented to Situation Alcohol / Substance Use: Not Applicable Psych Involvement: No (comment)  Admission diagnosis:  Symptomatic anemia [D64.9] Patient Active Problem List   Diagnosis Date Noted   Symptomatic anemia 05/06/2022  DUB (dysfunctional uterine bleeding) 05/06/2022   Obstructive sleep apnea 12/26/2019   Palpitations 11/21/2019   Menorrhagia with regular cycle 02/08/2019   Microcytic anemia 11/24/2018   Cardiac murmur 11/24/2018   Class 2 obesity due to excess calories with body  mass index (BMI) of 39.0 to 39.9 in adult 12/02/2006   ECZEMA, ATOPIC DERMATITIS 12/02/2006   PCP:  Ladell Pier, MD Pharmacy:   [facility] [place], [address] [place] [address] Phone: [phone] Fax: [phone]  [place] - [address] [place] [address] Phone: [phone] Fax: [phone]     Social Determinants of Health (SDOH) Interventions    Readmission Risk Interventions     No data to display

## 2022-05-07 NOTE — Progress Notes (Signed)
Transfusion ended. Patient tolerated well, VSS, no S/S of any adverse reaction.

## 2022-05-08 LAB — BPAM RBC
Blood Product Expiration Date: 202308202359
Blood Product Expiration Date: 202308282359
Blood Product Expiration Date: 202308312359
ISSUE DATE / TIME: 202308021004
ISSUE DATE / TIME: 202308021320
ISSUE DATE / TIME: 202308030742
Unit Type and Rh: 6200
Unit Type and Rh: 6200
Unit Type and Rh: 6200

## 2022-05-08 LAB — TYPE AND SCREEN
ABO/RH(D): A POS
Antibody Screen: NEGATIVE
Unit division: 0
Unit division: 0
Unit division: 0

## 2022-05-11 ENCOUNTER — Encounter (HOSPITAL_BASED_OUTPATIENT_CLINIC_OR_DEPARTMENT_OTHER): Payer: Self-pay

## 2022-05-12 ENCOUNTER — Encounter (HOSPITAL_BASED_OUTPATIENT_CLINIC_OR_DEPARTMENT_OTHER): Payer: Self-pay | Admitting: Advanced Practice Midwife

## 2022-05-12 ENCOUNTER — Ambulatory Visit (INDEPENDENT_AMBULATORY_CARE_PROVIDER_SITE_OTHER): Payer: Medicaid Other | Admitting: Advanced Practice Midwife

## 2022-05-12 VITALS — BP 116/58 | HR 54 | Ht 67.0 in | Wt 248.0 lb

## 2022-05-12 DIAGNOSIS — D649 Anemia, unspecified: Secondary | ICD-10-CM | POA: Diagnosis not present

## 2022-05-12 DIAGNOSIS — N939 Abnormal uterine and vaginal bleeding, unspecified: Secondary | ICD-10-CM | POA: Diagnosis not present

## 2022-05-12 DIAGNOSIS — Z01419 Encounter for gynecological examination (general) (routine) without abnormal findings: Secondary | ICD-10-CM | POA: Diagnosis not present

## 2022-05-12 DIAGNOSIS — N920 Excessive and frequent menstruation with regular cycle: Secondary | ICD-10-CM

## 2022-05-12 MED ORDER — SLYND 4 MG PO TABS: 1.0000 | ORAL_TABLET | Freq: Every day | ORAL | 11 refills | Status: DC

## 2022-05-12 NOTE — Progress Notes (Signed)
Subjective:     Julia Roberts is a 36 y.o. female here at [facility] for a follow up visit for abnormal uterine bleeding with anemia and for annual exam.  Current complaints: Pt feels much better after blood transfusion on 05/07/22 for symptomatic anemia. Prior to transfusion, she had dizziness, chest pain and SOB.  She reports heavy regular menses, using 3-4 pads every hour for first 2 days of menses.  She has never used hormones for contraception or treatment of bleeding.  Personal health questionnaire reviewed: yes.  Do you have a primary care provider? yes Do you feel safe at home? yes  [facility] Office Visit from 05/12/2022 in [place]  PHQ-2 Total Score 0       Health Maintenance Due  Topic Date Due   COVID-19 Vaccine (1) Never done   Hepatitis C Screening  Never done   TETANUS/TDAP  Never done   PAP SMEAR-Modifier  11/24/2021   INFLUENZA VACCINE  05/05/2022     Risk factors for chronic health problems: Smoking: Alchohol/how much: Pt BMI: Body mass index is 38.84 kg/m.   Gynecologic History Patient's last menstrual period was 04/20/2022. Contraception: none Last Pap: 11/24/2018. Results were: normal with negative HPV Last mammogram: n/a.   Obstetric History OB History  Gravida Para Term Preterm AB Living  '1 1 1     1  '$ SAB IAB Ectopic Multiple Live Births               # Outcome Date GA Lbr Len/2nd Weight Sex Delivery Anes PTL Lv  1 Term              The following portions of the patient's history were reviewed and updated as appropriate: allergies, current medications, past family history, past medical history, past social history, past surgical history, and problem list.  Review of Systems Pertinent items noted in HPI and remainder of comprehensive ROS otherwise negative.    Objective:   BP (!) 116/58 (BP Location: Right Arm, Patient Position: Sitting, Cuff Size: Large)   Pulse (!) 54   Ht '5\' 7"'$  (1.702 m) Comment: reported   Wt 248 lb (112.5 kg)   LMP 04/20/2022   BMI 38.84 kg/m  VS reviewed, nursing note reviewed,  Constitutional: well developed, well nourished, no distress HEENT: normocephalic CV: normal rate Pulm/chest wall: normal effort Breast Exam: exam performed: right breast normal without mass, skin or nipple changes or axillary nodes, left breast normal without mass, skin or nipple changes or axillary nodes Abdomen: soft Neuro: alert and oriented x 3 Skin: warm, dry Psych: affect normal Pelvic exam: Bimanual exam: Cervix 0/long/high, firm, anterior, neg CMT, uterus nontender, enlarged and irregular in shape, adnexa without tenderness, enlargement, or mass       Assessment/Plan:   1. Abnormal uterine bleeding (AUB) --Discussed options for treatment including Megace, Depo Provera, IUD, OCPs, POPs, and briefly reviewed surgical options. --Pt has never taken any hormones for pregnancy prevention or AUB --She has migraines with aura so estrogen is contraindicated. She plans another pregnancy so does not want surgical options at this time.  --She will consider IUD but is unsure today --Pt prefers option of monthly menses vs breakthrough bleeding with continuous progesterone dosing.   - Drospirenone (SLYND) 4 MG TABS; Take 1 tablet by mouth daily at 6 (six) AM.  Dispense: 28 tablet; Refill: 11  --Pelvic US to evaluate for fibroids, evaluate endometrial thickness.  - US PELVIS (TRANSABDOMINAL ONLY); Future  2. Symptomatic  anemia --Pt admitted to hospital 05/07/22 for blood transfusion with hgb of 5.  She feels much better today and denies dizziness, SOB or chest pain.   3. Menorrhagia with regular cycle    4. Well woman exam with routine gynecological exam    Addendum:  Pt called from pharmacy to report that Slynd is not well covered and will cost $200+.  Rx changed to Megace 40 mg BID, can reduce to daily if bleeding stops.   Return in about 3 months (around 08/12/2022) for Gyn follow up for  Abnormal Uterine Bleeding.   [name], CNM 5:46 PM

## 2022-05-13 MED ORDER — MEGESTROL ACETATE 40 MG PO TABS: 40.0000 mg | ORAL_TABLET | Freq: Two times a day (BID) | ORAL | 3 refills | Status: DC

## 2022-05-13 NOTE — Addendum Note (Signed)
Addended by: [name] A on: 05/13/2022 10:32 PM   Modules accepted: Orders

## 2022-05-20 ENCOUNTER — Ambulatory Visit (INDEPENDENT_AMBULATORY_CARE_PROVIDER_SITE_OTHER): Payer: Medicaid Other | Admitting: Family

## 2022-05-20 ENCOUNTER — Encounter (HOSPITAL_BASED_OUTPATIENT_CLINIC_OR_DEPARTMENT_OTHER): Payer: Self-pay | Admitting: Family

## 2022-05-20 VITALS — BP 96/70 | HR 54 | Ht 67.0 in | Wt 250.0 lb

## 2022-05-20 DIAGNOSIS — Z6839 Body mass index (BMI) 39.0-39.9, adult: Secondary | ICD-10-CM

## 2022-05-20 DIAGNOSIS — D500 Iron deficiency anemia secondary to blood loss (chronic): Secondary | ICD-10-CM | POA: Diagnosis not present

## 2022-05-20 DIAGNOSIS — I493 Ventricular premature depolarization: Secondary | ICD-10-CM | POA: Diagnosis not present

## 2022-05-20 DIAGNOSIS — R0789 Other chest pain: Secondary | ICD-10-CM

## 2022-05-20 DIAGNOSIS — R002 Palpitations: Secondary | ICD-10-CM

## 2022-05-20 MED ORDER — PROPRANOLOL HCL 20 MG PO TABS: 20.0000 mg | ORAL_TABLET | Freq: Two times a day (BID) | ORAL | 1 refills | Status: DC | PRN

## 2022-05-20 MED ORDER — FERROUS GLUCONATE 324 (38 FE) MG PO TABS: 324.0000 mg | ORAL_TABLET | Freq: Two times a day (BID) | ORAL | 5 refills | Status: DC

## 2022-05-20 NOTE — Patient Instructions (Signed)
Medication Instructions:  Your physician has recommended you make the following change in your medication:   STOP Metoprolol  START Propranolol '20mg'$  as needed for palpitations.    *If you need a refill on your cardiac medications before your next appointment, please call your pharmacy*   Lab Work: Your physician recommends that you return for lab work today: CBC  If you have labs (blood work) drawn today and your tests are completely normal, you will receive your results only by: MyChart Message (if you have [place]) OR A paper copy in the mail If you have any lab test that is abnormal or we need to change your treatment, we will call you to review the results.   Testing/Procedures: Your EKG today showed sinus bradycardia which is a normal heart rhythm.    Follow-Up: At [facility], you and your health needs are our priority.  As part of our continuing mission to provide you with exceptional heart care, we have created designated Provider Care Teams.  These Care Teams include your primary Cardiologist (physician) and Advanced Practice Providers (APPs -  Physician Assistants and Nurse Practitioners) who all work together to provide you with the care you need, when you need it.  We recommend signing up for the patient portal called "MyChart".  Sign up information is provided on this After Visit Summary.  MyChart is used to connect with patients for Virtual Visits (Telemedicine).  Patients are able to view lab/test results, encounter notes, upcoming appointments, etc.  Non-urgent messages can be sent to your provider as well.   To learn more about what you can do with MyChart, go to [email or web address].    Your next appointment:   2 month(s)  The format for your next appointment:   In Person or Virtual   Provider:   Dr. Gwenlyn Found or Laurann [place], NP  {  Other Instructions Heart Healthy Diet Recommendations: A low-salt diet is recommended. Meats should be grilled, baked, or  boiled. Avoid fried foods. Focus on lean protein sources like fish or chicken with vegetables and fruits. The American Heart Association is a Microbiologist!  American Heart Association Diet and Lifeystyle Recommendations   Exercise recommendations: The American Heart Association recommends 150 minutes of moderate intensity exercise weekly. Try 30 minutes of moderate intensity exercise 4-5 times per week. This could include walking, jogging, or swimming.   Important Information About Sugar

## 2022-05-20 NOTE — Progress Notes (Signed)
Office Visit    Patient Name: Julia Roberts Date of Encounter: 05/20/2022  PCP:  Ladell Pier, [place]  Cardiologist:  Quay Burow, MD / Dr. Claiborne Billings follows for sleep apnea Advanced Practice Provider:  No care team member to display Electrophysiologist:  None      Chief Complaint    Julia Roberts is a 36 y.o. female presents today for chest pain.    Past Medical History    Past Medical History:  Diagnosis Date   Anemia    Chlamydia    ECZEMA, ATOPIC DERMATITIS 12/02/2006   Qualifier: Diagnosis of  By: Herma Ard     Gonorrhea    Migraines    OBESITY, NOS 12/02/2006   Qualifier: Diagnosis of  By: Herma Ard     Palpitations    Sleep apnea    Past Surgical History:  Procedure Laterality Date   VAGINAL DELIVERY  2010    Allergies  No Known Allergies  History of Present Illness    Julia Roberts is a 36 y.o. female with a hx of obesity, palpitations last seen 05/05/2022.  Family history notable for CAD.  Echo 12/2019 LVEF 60 to 65%, no RWMA, no significant valvular normalities, normal diastolic function.  Monitor 12/14/2019 revealed probably normal sinus rhythm, frequent PVC, occasional Wenckebach.  She was set up for sleep study April 2021 noting sleep apnea and subsequently underwent titration study and was placed on BiPAP.  However BiPAP was returned to insurance company due to noncompliance. Repeat sleep study with very mild sleep apnea with supine sleep. No significant oxygen saturations. No recommendation for CPAP.   Seen 05/05/2022 noting palpitations, chest pain at rest and with activity, waking up with headaches, dizziness with position changes, generalized fatigue.  A1c, thyroid panel TSH, magnesium, CMP, CBC, iron panel ordered.  She was started on metoprolol succinate 25 mg daily.  Subsequently admitted 8/2 - 05/07/2022 after hemoglobin returned at 5.3. She was given 3 u PRBC and prescribed oral iron.  Discharge Hb 8.4.   She had follow-up with her OB/GYN 05/12/22 and started on Megace for bleeding.   She presents today for follow-up with her fianc. She reports feeling like her heart flutters. She notes more of her palpitations in the evening while taking metoprolol that were more forceful. She stopped taking her metoprolol 3 days ago and now has intermittent palpitations but not as forceful.  No chest pain, pressure, tightness.  Her lightheadedness has resolved.  Energy level much improved.  No edema, orthopnea, PND.  EKGs/Labs/Other Studies Reviewed:   The following studies were reviewed today:   EKG:  EKG is ordered today.  The ekg ordered today demonstrates SB 54 bpm with nonspecific T wave changes.   Recent Labs: 05/05/2022: Magnesium 2.0; TSH 1.070 05/06/2022: ALT 15; BUN 7; Creatinine, Ser 0.71; Potassium 4.1; Sodium 136 05/07/2022: Hemoglobin 8.4; Platelets 123  Recent Lipid Panel    Component Value Date/Time   CHOL 119 11/30/2019 0814   TRIG 43 11/30/2019 0814   HDL 51 11/30/2019 0814   CHOLHDL 2.3 11/30/2019 0814   LDLCALC 57 11/30/2019 0814      Home Medications   Current Meds  Medication Sig   megestrol (MEGACE) 40 MG tablet Take 1 tablet (40 mg total) by mouth 2 (two) times daily. You may decrease to 40 mg daily if you are not having bleeding and increase to twice per day when bleeding increases.   propranolol (INDERAL)  20 MG tablet Take 1 tablet (20 mg total) by mouth 2 (two) times daily as needed.   [DISCONTINUED] ferrous gluconate (FERGON) 324 MG tablet Take 1 tablet (324 mg total) by mouth 2 (two) times daily with a meal.     Review of Systems      All other systems reviewed and are otherwise negative except as noted above.  Physical Exam    VS:  BP 96/70   Pulse (!) 54   Ht '5\' 7"'$  (1.702 m)   Wt 250 lb (113.4 kg)   LMP 04/20/2022   BMI 39.16 kg/m  , BMI Body mass index is 39.16 kg/m.  Wt Readings from Last 3 Encounters:  05/20/22 250 lb (113.4 kg)   05/12/22 248 lb (112.5 kg)  05/06/22 246 lb 7.6 oz (111.8 kg)    GEN: Well nourished, overweight, well developed, in no acute distress. HEENT: normal. Neck: Supple, no JVD, carotid bruits, or masses. Cardiac: RRR, no murmurs, rubs, or gallops. No clubbing, cyanosis, edema.  Radials/PT 2+ and equal bilaterally.  Respiratory:  Respirations regular and unlabored, clear to auscultation bilaterally. GI: Soft, nontender, nondistended. MS: No deformity or atrophy. Skin: Warm and dry, no rash. Neuro:  Strength and sensation are intact. Psych: Normal affect.  Assessment & Plan    Chest pain -EKG today SB with nonspecific t wave changes. No chest pain, pressure, tightness.  Prior chest discomfort likely related to profound anemia. If chest pain recurs, consider coronary calcium score.   Obesity - Weight loss via diet and exercise encouraged. Discussed the impact being overweight would have on cardiovascular risk.  Congratulated on current weight loss.  Previously referred to [facility].  PVC /palpitations -PVC by prior monitor.Felt palpitations were more bothersome at night with Toprol. BP too low for CCB. Offered ZIO which she politely declines. Wishes to trial med changes first. Rx Propranolol PRN for palpitations. Educated to stay well hydrated, avoid caffeine, manage stress well. Recent K, magnesium, thyroid panel unremarkable.   Iron deficiency anemia - Recent admission requiring 3u PRBC. Repeat CBC today for monitoring as not yet established with PCP. Continue ferrous gluconate '324mg'$  BID. Refill provided. Further refills per PCP.  OSA -previous OSA with BiPAP however most recent sleep study November 2022 with no indication for CPAP.   Lightheadedness - Echo 2021 normal LVEF, no significant valvular abnormalities. Resolved since 3u PRBC during recent admission.          Disposition: Follow up in 1 month(s) with Quay Burow, MD or APP.  Signed, Loel Dubonnet, NP 05/20/2022, 9:03 AM [place]

## 2022-05-21 LAB — SPECIMEN STATUS REPORT

## 2022-05-21 LAB — CBC
Hematocrit: 38 % (ref 34.0–46.6)
Hemoglobin: 10.2 g/dL — ABNORMAL LOW (ref 11.1–15.9)
MCH: 21.3 pg — ABNORMAL LOW (ref 26.6–33.0)
MCHC: 26.8 g/dL — ABNORMAL LOW (ref 31.5–35.7)
MCV: 80 fL (ref 79–97)
Platelets: 438 10*3/uL (ref 150–450)
RBC: 4.78 x10E6/uL (ref 3.77–5.28)
RDW: 25.8 % — ABNORMAL HIGH (ref 11.7–15.4)
WBC: 5.2 10*3/uL (ref 3.4–10.8)

## 2022-06-05 ENCOUNTER — Ambulatory Visit (HOSPITAL_BASED_OUTPATIENT_CLINIC_OR_DEPARTMENT_OTHER): Payer: Medicaid Other | Admitting: Family

## 2022-06-11 ENCOUNTER — Encounter: Payer: Medicaid Other | Admitting: Advanced Practice Midwife

## 2022-06-17 ENCOUNTER — Other Ambulatory Visit (HOSPITAL_BASED_OUTPATIENT_CLINIC_OR_DEPARTMENT_OTHER): Payer: Self-pay | Admitting: Obstetrics & Gynecology

## 2022-06-17 ENCOUNTER — Ambulatory Visit (HOSPITAL_BASED_OUTPATIENT_CLINIC_OR_DEPARTMENT_OTHER): Payer: Medicaid Other

## 2022-06-17 DIAGNOSIS — N939 Abnormal uterine and vaginal bleeding, unspecified: Secondary | ICD-10-CM

## 2022-06-17 DIAGNOSIS — N920 Excessive and frequent menstruation with regular cycle: Secondary | ICD-10-CM

## 2022-06-17 MED ORDER — NORETHINDRONE ACETATE 5 MG PO TABS
10.0000 mg | ORAL_TABLET | Freq: Two times a day (BID) | ORAL | 0 refills | Status: DC
  Filled 2022-06-18: qty 60, 20d supply, fill #0

## 2022-06-17 NOTE — Addendum Note (Signed)
Addended by: Blenda Nicely on: 06/17/2022 01:14 PM   Modules accepted: Orders

## 2022-06-18 ENCOUNTER — Encounter (HOSPITAL_BASED_OUTPATIENT_CLINIC_OR_DEPARTMENT_OTHER): Payer: Self-pay | Admitting: Obstetrics & Gynecology

## 2022-06-18 ENCOUNTER — Other Ambulatory Visit (HOSPITAL_BASED_OUTPATIENT_CLINIC_OR_DEPARTMENT_OTHER): Payer: Self-pay

## 2022-06-18 ENCOUNTER — Ambulatory Visit (INDEPENDENT_AMBULATORY_CARE_PROVIDER_SITE_OTHER): Payer: Medicaid Other | Admitting: Obstetrics & Gynecology

## 2022-06-18 ENCOUNTER — Ambulatory Visit (HOSPITAL_BASED_OUTPATIENT_CLINIC_OR_DEPARTMENT_OTHER)
Admission: RE | Admit: 2022-06-18 | Discharge: 2022-06-18 | Disposition: A | Discharge status: Home / Self Care | Payer: Medicaid Other | Source: Ambulatory Visit | Attending: Obstetrics & Gynecology | Admitting: Obstetrics & Gynecology

## 2022-06-18 VITALS — BP 126/67 | HR 80 | Ht 67.0 in | Wt 249.2 lb

## 2022-06-18 DIAGNOSIS — N939 Abnormal uterine and vaginal bleeding, unspecified: Secondary | ICD-10-CM | POA: Insufficient documentation

## 2022-06-18 DIAGNOSIS — D250 Submucous leiomyoma of uterus: Secondary | ICD-10-CM

## 2022-06-18 DIAGNOSIS — N921 Excessive and frequent menstruation with irregular cycle: Secondary | ICD-10-CM

## 2022-06-18 DIAGNOSIS — D251 Intramural leiomyoma of uterus: Secondary | ICD-10-CM | POA: Diagnosis not present

## 2022-06-18 DIAGNOSIS — D509 Iron deficiency anemia, unspecified: Secondary | ICD-10-CM | POA: Diagnosis not present

## 2022-06-18 MED ORDER — TRANEXAMIC ACID 650 MG PO TABS
1300.0000 mg | ORAL_TABLET | Freq: Three times a day (TID) | ORAL | 1 refills | Status: DC
  Filled 2022-06-18: qty 30, 5d supply, fill #0
  Filled 2022-07-03: qty 30, 5d supply, fill #1

## 2022-06-19 ENCOUNTER — Encounter (HOSPITAL_BASED_OUTPATIENT_CLINIC_OR_DEPARTMENT_OTHER): Payer: Self-pay | Admitting: Obstetrics & Gynecology

## 2022-06-21 NOTE — Progress Notes (Signed)
GYNECOLOGY  VISIT  CC:   bleeding  HPI: 36 y.o. G44P1001 Single Black or African American female here for complaint of heavy bleeding.  Was seen by [name] on 8/8.  She was scheduled for ultrasound in office yesterday but called and called appt due to bleeding.  Was advised at that time she did not need to cancel.  However, then called later with complaint of significant bleeding and passing clots.  She was seen in ER 8/2 due to bleeding.  Was diagnosed with anemia and transfused 1 unit of blood.  She is on oral iron now.  Denies palpitation or SOB.  Ultrasound was done in radiology today.  I have reviewed these images personally.  Uterus 12 x 9 x 9 cm with 3.2cm and 3.7cm fibroids with smaller ones present.  Appears that one of these fibroids is either pushing up against the endometrial cavity, distorting it, or has a submucosal component.    Endometrial biopsy recommended.  Pt declines this and exam today due to amount of bleeding.  Denies SOB or palpitations.     Past Medical History:  Diagnosis Date   Anemia    Chlamydia    ECZEMA, ATOPIC DERMATITIS 12/02/2006   Qualifier: Diagnosis of  By: Herma Ard     Gonorrhea    Migraines    OBESITY, NOS 12/02/2006   Qualifier: Diagnosis of  By: Herma Ard     Palpitations    Sleep apnea     MEDS:   Current Outpatient Medications on File Prior to Visit  Medication Sig Dispense Refill   ferrous gluconate (FERGON) 324 MG tablet Take 1 tablet (324 mg total) by mouth 2 (two) times daily with a meal. 60 tablet 5   norethindrone (AYGESTIN) 5 MG tablet Take 2 tablets (10 mg total) by mouth in the morning and at bedtime. 60 tablet 0   propranolol (INDERAL) 20 MG tablet Take 1 tablet (20 mg total) by mouth 2 (two) times daily as needed. 30 tablet 1   No current facility-administered medications on file prior to visit.    ALLERGIES: Patient has no known allergies.  SH:  with partner, non smoker  Review of Systems   Constitutional: Negative.     PHYSICAL EXAMINATION:    BP 126/67 (BP Location: Right Arm, Patient Position: Sitting, Cuff Size: Large)   Pulse 80   Ht '5\' 7"'$  (1.702 m) Comment: Reported  Wt 249 lb 3.2 oz (113 kg)   BMI 39.03 kg/m     General appearance: alert, cooperative and appears stated age No exam performed as pt declined  Assessment/Plan: 1. Menorrhagia with irregular cycle - pt will stop hormonal medication and start aygestin '10mg'$  BID. - tranexamic acid (LYSTEDA) 650 MG TABS tablet; Take 2 tablets (1,300 mg total) by mouth 3 (three) times daily. Stop after 5 days.  Dispense: 30 tablet; Refill: 1  2. Intramural and submucous leiomyoma of uterus - Recommend she return for [facility] next week if possible  3. Microcytic anemia - on oral iron.  Will repeat this next week with hb.  Will check in with pt tomorrow due to amount of bleeding pt is complaining on today.   Total time with pt today: 38 minutes and 5 minutes spent with documentation.  Total time 43 minutes.

## 2022-06-24 ENCOUNTER — Other Ambulatory Visit (HOSPITAL_BASED_OUTPATIENT_CLINIC_OR_DEPARTMENT_OTHER): Payer: Self-pay | Admitting: Obstetrics & Gynecology

## 2022-06-24 ENCOUNTER — Encounter (HOSPITAL_BASED_OUTPATIENT_CLINIC_OR_DEPARTMENT_OTHER): Payer: Self-pay | Admitting: Obstetrics & Gynecology

## 2022-06-24 ENCOUNTER — Ambulatory Visit (HOSPITAL_BASED_OUTPATIENT_CLINIC_OR_DEPARTMENT_OTHER): Payer: Medicaid Other

## 2022-06-24 ENCOUNTER — Ambulatory Visit (INDEPENDENT_AMBULATORY_CARE_PROVIDER_SITE_OTHER): Payer: Medicaid Other

## 2022-06-24 ENCOUNTER — Other Ambulatory Visit (HOSPITAL_COMMUNITY)
Admission: RE | Admit: 2022-06-24 | Discharge: 2022-06-24 | Disposition: A | Discharge status: Home / Self Care | Payer: Medicaid Other | Source: Ambulatory Visit | Attending: Obstetrics & Gynecology | Admitting: Obstetrics & Gynecology

## 2022-06-24 ENCOUNTER — Ambulatory Visit (INDEPENDENT_AMBULATORY_CARE_PROVIDER_SITE_OTHER): Payer: Medicaid Other | Admitting: Obstetrics & Gynecology

## 2022-06-24 VITALS — BP 118/68 | HR 67 | Ht 67.0 in | Wt 249.8 lb

## 2022-06-24 DIAGNOSIS — D250 Submucous leiomyoma of uterus: Secondary | ICD-10-CM

## 2022-06-24 DIAGNOSIS — Z124 Encounter for screening for malignant neoplasm of cervix: Secondary | ICD-10-CM | POA: Diagnosis present

## 2022-06-24 DIAGNOSIS — D251 Intramural leiomyoma of uterus: Secondary | ICD-10-CM

## 2022-06-24 DIAGNOSIS — N921 Excessive and frequent menstruation with irregular cycle: Secondary | ICD-10-CM | POA: Diagnosis not present

## 2022-06-25 ENCOUNTER — Telehealth: Payer: Self-pay | Admitting: Pharmacy Technician

## 2022-06-25 ENCOUNTER — Other Ambulatory Visit (HOSPITAL_BASED_OUTPATIENT_CLINIC_OR_DEPARTMENT_OTHER): Payer: Self-pay | Admitting: Obstetrics & Gynecology

## 2022-06-25 DIAGNOSIS — N921 Excessive and frequent menstruation with irregular cycle: Secondary | ICD-10-CM

## 2022-06-25 DIAGNOSIS — D250 Submucous leiomyoma of uterus: Secondary | ICD-10-CM

## 2022-06-25 LAB — CBC
Hematocrit: 33.2 % — ABNORMAL LOW (ref 34.0–46.6)
Hemoglobin: 9.9 g/dL — ABNORMAL LOW (ref 11.1–15.9)
MCH: 24.3 pg — ABNORMAL LOW (ref 26.6–33.0)
MCHC: 29.8 g/dL — ABNORMAL LOW (ref 31.5–35.7)
MCV: 81 fL (ref 79–97)
Platelets: 271 10*3/uL (ref 150–450)
RBC: 4.08 x10E6/uL (ref 3.77–5.28)
RDW: 16.9 % — ABNORMAL HIGH (ref 11.7–15.4)
WBC: 3.5 10*3/uL (ref 3.4–10.8)

## 2022-06-25 LAB — IRON,TIBC AND FERRITIN PANEL
Ferritin: 18 ng/mL (ref 15–150)
Iron Saturation: 3 % — CL (ref 15–55)
Iron: 12 ug/dL — ABNORMAL LOW (ref 27–159)
Total Iron Binding Capacity: 372 ug/dL (ref 250–450)
UIBC: 360 ug/dL (ref 131–425)

## 2022-06-25 NOTE — Telephone Encounter (Signed)
Dr. Sabra Heck, Juluis Rainier note:  Auth Submission: NO AUTH NEEDED Payer: [employer] [place] medicaid Medication & CPT/J Code(s) submitted: Venofer (Iron Sucrose) J1756 Route of submission (phone, fax, portal):  Phone # Fax # Auth type: Buy/Bill Units/visits requested: x5 Reference number: active insurance ref# [id number] Approval from: 06/25/22 to 10/04/22   Patient will be scheduled as soon as possible

## 2022-06-26 LAB — CYTOLOGY - PAP
Comment: NEGATIVE
Diagnosis: NEGATIVE
High risk HPV: NEGATIVE

## 2022-06-27 ENCOUNTER — Encounter (HOSPITAL_BASED_OUTPATIENT_CLINIC_OR_DEPARTMENT_OTHER): Payer: Self-pay | Admitting: Obstetrics & Gynecology

## 2022-07-02 ENCOUNTER — Encounter (HOSPITAL_BASED_OUTPATIENT_CLINIC_OR_DEPARTMENT_OTHER): Payer: Self-pay | Admitting: Obstetrics & Gynecology

## 2022-07-03 ENCOUNTER — Ambulatory Visit (HOSPITAL_BASED_OUTPATIENT_CLINIC_OR_DEPARTMENT_OTHER): Payer: Medicaid Other | Admitting: Family

## 2022-07-03 ENCOUNTER — Ambulatory Visit (INDEPENDENT_AMBULATORY_CARE_PROVIDER_SITE_OTHER): Payer: Medicaid Other

## 2022-07-03 VITALS — BP 107/69 | HR 80 | Temp 98.8°F | Resp 16 | Ht 67.0 in | Wt 254.2 lb

## 2022-07-03 DIAGNOSIS — D250 Submucous leiomyoma of uterus: Secondary | ICD-10-CM | POA: Diagnosis not present

## 2022-07-03 DIAGNOSIS — N921 Excessive and frequent menstruation with irregular cycle: Secondary | ICD-10-CM

## 2022-07-03 DIAGNOSIS — D509 Iron deficiency anemia, unspecified: Secondary | ICD-10-CM

## 2022-07-03 DIAGNOSIS — D500 Iron deficiency anemia secondary to blood loss (chronic): Secondary | ICD-10-CM

## 2022-07-03 MED ORDER — SODIUM CHLORIDE 0.9 % IV SOLN
200.0000 mg | INTRAVENOUS | Status: DC
  Administered 2022-07-03: 200 mg via INTRAVENOUS
  Filled 2022-07-03: qty 10

## 2022-07-03 NOTE — Progress Notes (Signed)
Diagnosis: Iron Deficiency Anemia  Provider:  Marshell Garfinkel MD  Procedure: Infusion  IV Type: Peripheral, IV Location: R Antecubital  Venofer (Iron Sucrose), Dose: 200 mg  Infusion Start Time: 2395  Infusion Stop Time: 1430  Post Infusion IV Care: Observation period completed and Peripheral IV Discontinued  Discharge: Condition: Good, Destination: Home . AVS provided to patient.   Performed by:  Cleophus Molt, RN

## 2022-07-04 DIAGNOSIS — D251 Intramural leiomyoma of uterus: Secondary | ICD-10-CM | POA: Insufficient documentation

## 2022-07-04 DIAGNOSIS — D250 Submucous leiomyoma of uterus: Secondary | ICD-10-CM | POA: Insufficient documentation

## 2022-07-04 NOTE — Progress Notes (Signed)
GYNECOLOGY  VISIT  CC:   menorrhagia  HPI: 36 y.o. G64P1001 Single Black or [place] American female here for sonohysterogram and discussion of results.  Pt is having extremely heavy bleeding.  Is currently on lysteda and progesterone and bleeding is better.  Ultrasound today showed three polyps, four fibroids with two of these being fully submucosal and one partially submucosal.  Sonata treatment with resection vs just resection vs hysterectomy discussed today.  Pt is getting tired of bleeding.  Getting ready to start new job so timing is not good right now.  Procedures, recovery, risks/benefits discussed.  She is not sure what she wants to do yet.  Will need to talk to partner as they have discussed possibly pregnancy.  Although this will be more difficult with pt if continues to have issues with fibroids.  She will let me know her thoughts.   Past Medical History:  Diagnosis Date   Anemia    Chlamydia    ECZEMA, ATOPIC DERMATITIS 12/02/2006   Qualifier: Diagnosis of  By: Herma Ard     Gonorrhea    Migraines    OBESITY, NOS 12/02/2006   Qualifier: Diagnosis of  By: Herma Ard     Palpitations    Sleep apnea     MEDS:   Current Outpatient Medications on File Prior to Visit  Medication Sig Dispense Refill   ferrous gluconate (FERGON) 324 MG tablet Take 1 tablet (324 mg total) by mouth 2 (two) times daily with a meal. 60 tablet 5   norethindrone (AYGESTIN) 5 MG tablet Take 2 tablets (10 mg total) by mouth in the morning and at bedtime. 60 tablet 0   propranolol (INDERAL) 20 MG tablet Take 1 tablet (20 mg total) by mouth 2 (two) times daily as needed. 30 tablet 1   tranexamic acid (LYSTEDA) 650 MG TABS tablet Take 2 tablets (1,300 mg total) by mouth 3 (three) times daily. Stop after 5 days. 30 tablet 1   No current facility-administered medications on file prior to visit.    ALLERGIES: Patient has no known allergies.  SH:  partnered  Review of Systems   Genitourinary:        Menorrhagia    PHYSICAL EXAMINATION:    BP 118/68 (BP Location: Left Arm, Patient Position: Sitting, Cuff Size: Large)   Pulse 67   Ht '5\' 7"'$  (1.702 m) Comment: reported  Wt 249 lb 12.8 oz (113.3 kg)   BMI 39.12 kg/m     General appearance: alert, cooperative and appears stated age Lymph:  no inguinal LAD noted  Pelvic: External genitalia:  no lesions              Urethra:  normal appearing urethra with no masses, tenderness or lesions              Bartholins and Skenes: normal                 Vagina: normal appearing vagina with normal color and discharge, no lesions              Cervix: no lesions.  Pap smear due and obtained today              Bimanual Exam:  Uterus:  enlarged, 12 -14  weeks size, globular              Adnexa: no mass, fullness, tenderness  Chaperone, Caryl Pina Friday, was present for exam.  Assessment/Plan: 1. Menorrhagia with irregular cycle - will check hemoglobin and  iron levels today - CBC - Iron, TIBC and Ferritin Panel  2. Intramural and submucous leiomyoma of uterus - hysteroscopy resection of polyps and fibroids discussed as well as   3. Cervical cancer screening - Cytology - PAP( [place])

## 2022-07-06 ENCOUNTER — Ambulatory Visit (INDEPENDENT_AMBULATORY_CARE_PROVIDER_SITE_OTHER): Payer: Medicaid Other | Admitting: *Deleted

## 2022-07-06 ENCOUNTER — Ambulatory Visit (INDEPENDENT_AMBULATORY_CARE_PROVIDER_SITE_OTHER): Payer: Medicaid Other

## 2022-07-06 ENCOUNTER — Other Ambulatory Visit (HOSPITAL_BASED_OUTPATIENT_CLINIC_OR_DEPARTMENT_OTHER): Payer: Self-pay

## 2022-07-06 VITALS — BP 100/63 | HR 68 | Temp 98.1°F | Resp 16 | Ht 67.0 in | Wt 256.4 lb

## 2022-07-06 DIAGNOSIS — N921 Excessive and frequent menstruation with irregular cycle: Secondary | ICD-10-CM | POA: Diagnosis not present

## 2022-07-06 DIAGNOSIS — D509 Iron deficiency anemia, unspecified: Secondary | ICD-10-CM

## 2022-07-06 DIAGNOSIS — D250 Submucous leiomyoma of uterus: Secondary | ICD-10-CM | POA: Diagnosis not present

## 2022-07-06 DIAGNOSIS — Z30013 Encounter for initial prescription of injectable contraceptive: Secondary | ICD-10-CM | POA: Diagnosis not present

## 2022-07-06 DIAGNOSIS — D500 Iron deficiency anemia secondary to blood loss (chronic): Secondary | ICD-10-CM | POA: Diagnosis not present

## 2022-07-06 MED ORDER — SODIUM CHLORIDE 0.9 % IV SOLN
200.0000 mg | INTRAVENOUS | Status: DC
  Administered 2022-07-06: 200 mg via INTRAVENOUS
  Filled 2022-07-06: qty 10

## 2022-07-06 MED ORDER — MEDROXYPROGESTERONE ACETATE 150 MG/ML IM SUSP
150.0000 mg | Freq: Once | INTRAMUSCULAR | Status: AC
  Administered 2022-07-06: 150 mg via INTRAMUSCULAR

## 2022-07-06 NOTE — Progress Notes (Signed)
Pt here for depo injection. Tolerated well. Pt to return next month for monthly dosing per Dr. Sabra Heck.

## 2022-07-06 NOTE — Progress Notes (Signed)
Diagnosis: Iron Deficiency Anemia  Provider:  Marshell Garfinkel MD  Procedure: Infusion  IV Type: Peripheral, IV Location: R Antecubital  Venofer (Iron Sucrose), Dose: 200 mg  Infusion Start Time: 1400  Infusion Stop Time: 0479  Post Infusion IV Care: Peripheral IV Discontinued  Discharge: Condition: Good, Destination: Home . AVS provided to patient.   Performed by:  Adelina Mings, LPN

## 2022-07-07 ENCOUNTER — Ambulatory Visit (HOSPITAL_BASED_OUTPATIENT_CLINIC_OR_DEPARTMENT_OTHER): Payer: Medicaid Other

## 2022-07-07 ENCOUNTER — Encounter (HOSPITAL_BASED_OUTPATIENT_CLINIC_OR_DEPARTMENT_OTHER): Payer: Medicaid Other | Admitting: Advanced Practice Midwife

## 2022-07-08 ENCOUNTER — Ambulatory Visit: Payer: Medicaid Other

## 2022-07-08 MED ORDER — SODIUM CHLORIDE 0.9 % IV SOLN
200.0000 mg | INTRAVENOUS | Status: DC
  Filled 2022-07-08: qty 10

## 2022-07-10 ENCOUNTER — Other Ambulatory Visit (HOSPITAL_BASED_OUTPATIENT_CLINIC_OR_DEPARTMENT_OTHER): Payer: Self-pay | Admitting: Obstetrics & Gynecology

## 2022-07-10 ENCOUNTER — Encounter (HOSPITAL_BASED_OUTPATIENT_CLINIC_OR_DEPARTMENT_OTHER): Payer: Self-pay | Admitting: Obstetrics & Gynecology

## 2022-07-10 MED ORDER — MYFEMBREE 40-1-0.5 MG PO TABS: 1.0000 | ORAL_TABLET | Freq: Every day | ORAL | 2 refills | Status: DC

## 2022-07-14 ENCOUNTER — Other Ambulatory Visit (HOSPITAL_BASED_OUTPATIENT_CLINIC_OR_DEPARTMENT_OTHER): Payer: Self-pay | Admitting: Obstetrics & Gynecology

## 2022-07-17 ENCOUNTER — Ambulatory Visit (HOSPITAL_BASED_OUTPATIENT_CLINIC_OR_DEPARTMENT_OTHER): Payer: Medicaid Other | Admitting: Family

## 2022-07-17 NOTE — Progress Notes (Deleted)
Office Visit    Patient Name: Julia Roberts Date of Encounter: 07/17/2022  PCP:  Ladell Pier, MD   [place]  Cardiologist:  Quay Burow, MD / Dr. Claiborne Billings follows for sleep apnea Advanced Practice Provider:  No care team member to display Electrophysiologist:  None      Chief Complaint    [place] Julia Roberts is a 36 y.o. female presents today for ***  Past Medical History    Past Medical History:  Diagnosis Date   Anemia    Chlamydia    ECZEMA, ATOPIC DERMATITIS 12/02/2006   Qualifier: Diagnosis of  By: Herma Ard     Gonorrhea    Migraines    OBESITY, NOS 12/02/2006   Qualifier: Diagnosis of  By: Herma Ard     Palpitations    Sleep apnea    Past Surgical History:  Procedure Laterality Date   VAGINAL DELIVERY  2010    Allergies  No Known Allergies  History of Present Illness    [place] Julia Leisner is a 36 y.o. female with a hx of obesity, palpitations last seen 05/20/2022.  Family history notable for CAD.  Echo 12/2019 LVEF 60 to 65%, no RWMA, no significant valvular normalities, normal diastolic function.  Monitor 12/14/2019 revealed probably normal sinus rhythm, frequent PVC, occasional Wenckebach.  She was set up for sleep study April 2021 noting sleep apnea and subsequently underwent titration study and was placed on BiPAP.  However BiPAP was returned to insurance company due to noncompliance. Repeat sleep study with very mild sleep apnea with supine sleep. No significant oxygen saturations. No recommendation for CPAP.   Seen 05/05/2022 noting palpitations, chest pain at rest and with activity, waking up with headaches, dizziness with position changes, generalized fatigue.  A1c, thyroid panel TSH, magnesium, CMP, CBC, iron panel ordered.  She was started on metoprolol succinate 25 mg daily.  Subsequently admitted 8/2 - 05/07/2022 after hemoglobin returned at 5.3. She was given 3 u PRBC and prescribed oral iron.  Discharge Hb 8.4.   She had follow-up with her OB/GYN 05/12/22 and started on Megace for bleeding.   Last seen 05/20/22 noting palpitations which she felt were more forceful since starting metoprolol.  Metoprolol is discontinued and she was started on propanolol 20 mg as needed for palpitations.  She presents today for follow-up with her fianc. She reports feeling like her heart flutters. She notes more of her palpitations in the evening while taking metoprolol that were more forceful. She stopped taking her metoprolol 3 days ago and now has intermittent palpitations but not as forceful.  No chest pain, pressure, tightness.  Her lightheadedness has resolved.  Energy level much improved.  No edema, orthopnea, PND.***  EKGs/Labs/Other Studies Reviewed:   The following studies were reviewed today:   EKG:  No EKG today.   Recent Labs: 05/05/2022: Magnesium 2.0; TSH 1.070 05/06/2022: ALT 15; BUN 7; Creatinine, Ser 0.71; Potassium 4.1; Sodium 136 06/24/2022: Hemoglobin 9.9; Platelets 271  Recent Lipid Panel    Component Value Date/Time   CHOL 119 11/30/2019 0814   TRIG 43 11/30/2019 0814   HDL 51 11/30/2019 0814   CHOLHDL 2.3 11/30/2019 0814   LDLCALC 57 11/30/2019 0814   Home Medications   No outpatient medications have been marked as taking for the 07/17/22 encounter (Appointment) with Loel Dubonnet, NP.     Review of Systems      All other systems reviewed and are otherwise negative except as  noted above.  Physical Exam    VS:  There were no vitals taken for this visit. , BMI There is no height or weight on file to calculate BMI.  Wt Readings from Last 3 Encounters:  07/06/22 256 lb 6.4 oz (116.3 kg)  07/03/22 254 lb 3.2 oz (115.3 kg)  06/24/22 249 lb 12.8 oz (113.3 kg)    GEN: Well nourished, overweight, well developed, in no acute distress. HEENT: normal. Neck: Supple, no JVD, carotid bruits, or masses. Cardiac: RRR, no murmurs, rubs, or gallops. No clubbing, cyanosis,  edema.  Radials/PT 2+ and equal bilaterally.  Respiratory:  Respirations regular and unlabored, clear to auscultation bilaterally. GI: Soft, nontender, nondistended. MS: No deformity or atrophy. Skin: Warm and dry, no rash. Neuro:  Strength and sensation are intact. Psych: Normal affect.  Assessment & Plan    Chest pain -EKG today SB with nonspecific t wave changes. No chest pain, pressure, tightness.  Prior chest discomfort likely related to profound anemia. If chest pain recurs, consider coronary calcium score. ***  Obesity - Weight loss via diet and exercise encouraged. Discussed the impact being overweight would have on cardiovascular risk.  Congratulated on current weight loss.  Previously referred to [facility].***  PVC /palpitations -PVC by prior monitor.Felt palpitations were more bothersome at night with Toprol. BP too low for CCB. Offered ZIO which she politely declines. Wishes to trial med changes first. Rx Propranolol PRN for palpitations. Educated to stay well hydrated, avoid caffeine, manage stress well. Recent K, magnesium, thyroid panel unremarkable. ***  Iron deficiency anemia - Recent admission requiring 3u PRBC. Repeat CBC today for monitoring as not yet established with PCP. Continue ferrous gluconate '324mg'$  BID. Refill provided. Further refills per PCP.***  OSA -previous OSA with BiPAP however most recent sleep study November 2022 with no indication for CPAP. ***  Lightheadedness - Echo 2021 normal LVEF, no significant valvular abnormalities. Resolved since 3u PRBC during recent admission. ***  No BP recorded.  {Refresh Note OR Click here to enter BP  :1}***      Disposition: Follow up in 1 month(s) with Quay Burow, MD or APP.  Signed, Loel Dubonnet, NP 07/17/2022, 8:01 AM [place]

## 2022-07-22 ENCOUNTER — Encounter (HOSPITAL_BASED_OUTPATIENT_CLINIC_OR_DEPARTMENT_OTHER): Payer: Self-pay | Admitting: Obstetrics & Gynecology

## 2022-07-22 ENCOUNTER — Telehealth: Payer: Self-pay

## 2022-07-22 ENCOUNTER — Encounter (HOSPITAL_BASED_OUTPATIENT_CLINIC_OR_DEPARTMENT_OTHER): Payer: Self-pay

## 2022-07-22 NOTE — Telephone Encounter (Signed)
Called patient twice, no answer, left voicemail explaining I was calling to discuss potential surgery dates, since I was unable to get her on the phone, she will be scheduled on first available 10/31 and to give me a call back if that day does not work for her.

## 2022-07-24 ENCOUNTER — Encounter: Payer: Self-pay | Admitting: Obstetrics & Gynecology

## 2022-07-25 ENCOUNTER — Encounter (HOSPITAL_BASED_OUTPATIENT_CLINIC_OR_DEPARTMENT_OTHER): Payer: Self-pay | Admitting: Obstetrics & Gynecology

## 2022-07-28 ENCOUNTER — Other Ambulatory Visit (HOSPITAL_BASED_OUTPATIENT_CLINIC_OR_DEPARTMENT_OTHER): Payer: Medicaid Other

## 2022-07-28 ENCOUNTER — Encounter (HOSPITAL_BASED_OUTPATIENT_CLINIC_OR_DEPARTMENT_OTHER): Payer: Self-pay | Admitting: Obstetrics & Gynecology

## 2022-07-28 ENCOUNTER — Ambulatory Visit (INDEPENDENT_AMBULATORY_CARE_PROVIDER_SITE_OTHER): Payer: Medicaid Other | Admitting: Obstetrics & Gynecology

## 2022-07-28 VITALS — BP 125/57 | HR 60 | Ht 67.0 in | Wt 251.2 lb

## 2022-07-28 DIAGNOSIS — N921 Excessive and frequent menstruation with irregular cycle: Secondary | ICD-10-CM | POA: Diagnosis not present

## 2022-07-28 DIAGNOSIS — D250 Submucous leiomyoma of uterus: Secondary | ICD-10-CM

## 2022-07-28 DIAGNOSIS — E611 Iron deficiency: Secondary | ICD-10-CM

## 2022-07-28 DIAGNOSIS — Z862 Personal history of diseases of the blood and blood-forming organs and certain disorders involving the immune mechanism: Secondary | ICD-10-CM

## 2022-07-28 DIAGNOSIS — D251 Intramural leiomyoma of uterus: Secondary | ICD-10-CM

## 2022-07-28 LAB — CBC WITH DIFFERENTIAL/PLATELET
Basophils Absolute: 0 10*3/uL (ref 0.0–0.2)
Basos: 1 %
EOS (ABSOLUTE): 0.1 10*3/uL (ref 0.0–0.4)
Eos: 2 %
Hematocrit: 33 % — ABNORMAL LOW (ref 34.0–46.6)
Hemoglobin: 9.7 g/dL — ABNORMAL LOW (ref 11.1–15.9)
Immature Grans (Abs): 0 10*3/uL (ref 0.0–0.1)
Immature Granulocytes: 0 %
Lymphocytes Absolute: 1.4 10*3/uL (ref 0.7–3.1)
Lymphs: 43 %
MCH: 23.3 pg — ABNORMAL LOW (ref 26.6–33.0)
MCHC: 29.4 g/dL — ABNORMAL LOW (ref 31.5–35.7)
MCV: 79 fL (ref 79–97)
Monocytes Absolute: 0.3 10*3/uL (ref 0.1–0.9)
Monocytes: 9 %
Neutrophils Absolute: 1.5 10*3/uL (ref 1.4–7.0)
Neutrophils: 45 %
Platelets: 320 10*3/uL (ref 150–450)
RBC: 4.16 x10E6/uL (ref 3.77–5.28)
RDW: 13.1 % (ref 11.7–15.4)
WBC: 3.3 10*3/uL — ABNORMAL LOW (ref 3.4–10.8)

## 2022-07-29 ENCOUNTER — Other Ambulatory Visit (HOSPITAL_BASED_OUTPATIENT_CLINIC_OR_DEPARTMENT_OTHER): Payer: Self-pay | Admitting: Obstetrics & Gynecology

## 2022-07-29 DIAGNOSIS — Z01818 Encounter for other preprocedural examination: Secondary | ICD-10-CM

## 2022-07-29 DIAGNOSIS — D251 Intramural leiomyoma of uterus: Secondary | ICD-10-CM

## 2022-07-29 DIAGNOSIS — N921 Excessive and frequent menstruation with irregular cycle: Secondary | ICD-10-CM

## 2022-07-30 ENCOUNTER — Encounter (HOSPITAL_BASED_OUTPATIENT_CLINIC_OR_DEPARTMENT_OTHER): Payer: Self-pay | Admitting: *Deleted

## 2022-07-30 NOTE — Pre-Procedure Instructions (Signed)
Surgical Instructions    Your procedure is scheduled on Tuesday, August 04, 2022 at 9:30 AM.  Report to [facility] Main Entrance "A" at 7:30 A.M., then check in with the Admitting office.  Call this number if you have problems the morning of surgery:  [phone]   If you have any questions prior to your surgery date call [phone]: Open Monday-Friday 8am-4pm  *If you experience any cold or flu symptoms such as cough, fever, chills, shortness of breath, etc. between now and your scheduled surgery, please notify us.*    Remember:  Do not eat after midnight the night before your surgery  You may drink clear liquids until 6:30 AM the morning of your surgery.   Clear liquids allowed are: Water, Non-Citrus Juices (without pulp), Carbonated Beverages, Clear Tea, Black Coffee Only (NO MILK, CREAM OR POWDERED CREAMER of any kind), and Gatorade.    Take these medicines the morning of surgery with A SIP OF WATER:  propranolol (INDERAL) - if needed   As of today, STOP taking any Aspirin (unless otherwise instructed by your surgeon) Aleve, Naproxen, Ibuprofen, Motrin, Advil, Goody's, BC's, all herbal medications, fish oil, and all vitamins.                     Do NOT Smoke (Tobacco/Vaping) for 24 hours prior to your procedure.  If you use a CPAP at night, you may bring your mask/headgear for your overnight stay.   Contacts, glasses, piercing's, hearing aid's, dentures or partials may not be worn into surgery, please bring cases for these belongings.    For patients admitted to the hospital, discharge time will be determined by your treatment team.   Patients discharged the day of surgery will not be allowed to drive home, and someone needs to stay with them for 24 hours.  SURGICAL WAITING ROOM VISITATION Patients having surgery or a procedure may have two support people in the waiting area. Visitors may stay in the waiting area during the procedure and switch out with other visitors  if needed. Children under the age of 63 must have an adult accompany them who is not the patient. If the patient needs to stay at the hospital during part of their recovery, the visitor guidelines for inpatient rooms apply.  Please refer to the [facility] website for the visitor guidelines for Inpatients (after your surgery is over and you are in a regular room).    Special instructions:   [place]- Preparing For Surgery  Before surgery, you can play an important role. Because skin is not sterile, your skin needs to be as free of germs as possible. You can reduce the number of germs on your skin by washing with CHG (chlorahexidine gluconate) Soap before surgery.  CHG is an antiseptic cleaner which kills germs and bonds with the skin to continue killing germs even after washing.    Oral Hygiene is also important to reduce your risk of infection.  Remember - BRUSH YOUR TEETH THE MORNING OF SURGERY WITH YOUR REGULAR TOOTHPASTE  Please do not use if you have an allergy to CHG or antibacterial soaps. If your skin becomes reddened/irritated stop using the CHG.  Do not shave (including legs and underarms) for at least 48 hours prior to first CHG shower. It is OK to shave your face.  Please follow these instructions carefully.   Shower the NIGHT BEFORE SURGERY and the MORNING OF SURGERY  If you chose to wash your hair, wash your  hair first as usual with your normal shampoo.  After you shampoo, rinse your hair and body thoroughly to remove the shampoo.  Use CHG Soap as you would any other liquid soap. You can apply CHG directly to the skin and wash gently with a scrungie or a clean washcloth.   Apply the CHG Soap to your body ONLY FROM THE NECK DOWN.  Do not use on open wounds or open sores. Avoid contact with your eyes, ears, mouth and genitals (private parts). Wash Face and genitals (private parts)  with your normal soap.   Wash thoroughly, paying special attention to the area where your  surgery will be performed.  Thoroughly rinse your body with warm water from the neck down.  DO NOT shower/wash with your normal soap after using and rinsing off the CHG Soap.  Pat yourself dry with a CLEAN TOWEL.  Wear CLEAN PAJAMAS to bed the night before surgery  Place CLEAN SHEETS on your bed the night before your surgery  DO NOT SLEEP WITH PETS.   Day of Surgery: Take a shower with CHG soap. Do not wear jewelry or makeup Do not wear lotions, powders, perfumes/colognes, or deodorant. Do not shave 48 hours prior to surgery.  Do not bring valuables to the hospital.  [facility] is not responsible for any belongings or valuables. Do not wear nail polish, gel polish, artificial nails, or any other type of covering on natural nails (fingers and toes) If you have artificial nails or gel coating that need to be removed by a nail salon, please have this removed prior to surgery. Artificial nails or gel coating may interfere with anesthesia's ability to adequately monitor your vital signs. Wear Clean/Comfortable clothing the morning of surgery Do not apply any deodorants/lotions.   Remember to brush your teeth WITH YOUR REGULAR TOOTHPASTE.   Please read over the following fact sheets that you were given.  If you received a COVID test during your pre-op visit  it is requested that you wear a mask when out in public, stay away from anyone that may not be feeling well and notify your surgeon if you develop symptoms. If you have been in contact with anyone that has tested positive in the last 10 days please notify you surgeon.

## 2022-07-31 ENCOUNTER — Other Ambulatory Visit: Payer: Self-pay

## 2022-07-31 ENCOUNTER — Encounter (HOSPITAL_COMMUNITY): Payer: Self-pay

## 2022-07-31 ENCOUNTER — Encounter (HOSPITAL_COMMUNITY)
Admission: RE | Admit: 2022-07-31 | Discharge: 2022-07-31 | Disposition: A | Discharge status: Home / Self Care | Payer: Medicaid Other | Source: Ambulatory Visit | Attending: Obstetrics & Gynecology | Admitting: Obstetrics & Gynecology

## 2022-07-31 VITALS — BP 116/49 | HR 83 | Temp 98.8°F | Resp 18 | Ht 67.0 in | Wt 247.6 lb

## 2022-07-31 DIAGNOSIS — Z01812 Encounter for preprocedural laboratory examination: Secondary | ICD-10-CM | POA: Insufficient documentation

## 2022-07-31 DIAGNOSIS — D252 Subserosal leiomyoma of uterus: Secondary | ICD-10-CM | POA: Diagnosis not present

## 2022-07-31 DIAGNOSIS — D251 Intramural leiomyoma of uterus: Secondary | ICD-10-CM | POA: Insufficient documentation

## 2022-07-31 DIAGNOSIS — Z01818 Encounter for other preprocedural examination: Secondary | ICD-10-CM

## 2022-07-31 DIAGNOSIS — R002 Palpitations: Secondary | ICD-10-CM | POA: Insufficient documentation

## 2022-07-31 DIAGNOSIS — N921 Excessive and frequent menstruation with irregular cycle: Secondary | ICD-10-CM | POA: Insufficient documentation

## 2022-07-31 LAB — CBC
HCT: 36.4 % (ref 36.0–46.0)
Hemoglobin: 10.8 g/dL — ABNORMAL LOW (ref 12.0–15.0)
MCH: 23.7 pg — ABNORMAL LOW (ref 26.0–34.0)
MCHC: 29.7 g/dL — ABNORMAL LOW (ref 30.0–36.0)
MCV: 80 fL (ref 80.0–100.0)
Platelets: 234 10*3/uL (ref 150–400)
RBC: 4.55 MIL/uL (ref 3.87–5.11)
RDW: 12.9 % (ref 11.5–15.5)
WBC: 3.9 10*3/uL — ABNORMAL LOW (ref 4.0–10.5)
nRBC: 0 % (ref 0.0–0.2)

## 2022-07-31 LAB — BASIC METABOLIC PANEL
Anion gap: 3 — ABNORMAL LOW (ref 5–15)
BUN: 8 mg/dL (ref 6–20)
CO2: 26 mmol/L (ref 22–32)
Calcium: 10.2 mg/dL (ref 8.9–10.3)
Chloride: 109 mmol/L (ref 98–111)
Creatinine, Ser: 0.82 mg/dL (ref 0.44–1.00)
GFR, Estimated: >60 mL/min (ref >60)
Glucose, Bld: 107 mg/dL — ABNORMAL HIGH (ref 70–99)
Potassium: 3.8 mmol/L (ref 3.5–5.1)
Sodium: 138 mmol/L (ref 135–145)

## 2022-07-31 LAB — TYPE AND SCREEN
ABO/RH(D): A POS
Antibody Screen: NEGATIVE

## 2022-07-31 NOTE — Progress Notes (Signed)
PCP - Dr. Karle Plumber Cardiologist - Denies  PPM/ICD - Denies  Chest x-ray - N/A EKG - 05/20/22 Stress Test - Denies ECHO - 12/05/19 Cardiac Cath - Denies  Sleep Study - Denies CPAP - No  Diabetes: Denies  Blood Thinner Instructions: N/A Aspirin Instructions: N/A  ERAS Protcol - Yes PRE-SURGERY Ensure or G2- No  COVID TEST- N/A   Anesthesia review: Yes, cardiac hx  Patient denies shortness of breath, fever, cough and chest pain at PAT appointment   All instructions explained to the patient, with a verbal understanding of the material. Patient agrees to go over the instructions while at home for a better understanding. Patient also instructed to self quarantine after being tested for COVID-19. The opportunity to ask questions was provided.

## 2022-08-03 ENCOUNTER — Encounter (HOSPITAL_BASED_OUTPATIENT_CLINIC_OR_DEPARTMENT_OTHER): Payer: Self-pay | Admitting: Obstetrics & Gynecology

## 2022-08-03 NOTE — Progress Notes (Signed)
36 y.o. G1P1001 Single Black or [place] American female here for discussion of upcoming procedure.  TLH/bilateral salpingectomy with cystoscopy planned due to fibroid uterus and history of menorrhagia.  Pre-op evaluation thus far has included utrasound with uterus measuring 12 x 1 x 9cm measuring 634m.  Multiple fibroids noted with several submucosal ones present.  She was initially considering sonata treatment but given the amount of bleeding she's had and iron infusions she needed to receive, she is ready for definitve treatment.  Aware that she will not be able to have future child bearing.  Has one child and is very comfortable with this. In female/female relationship so states partner can have children if they decide to proceed down that path.     Procedure discussed with patient.  Hospital stay, recovery and pain management all discussed.  Risks discussed including but not limited to bleeding, 1% risk of receiving a  transfusion, infection, 3-4% risk of bowel/bladder/ureteral/vascular injury discussed as well as possible need for additional surgery if injury does occur discussed.  DVT/PE and rare risk of death discussed.  My actual complications with prior surgeries discussed.  Vaginal cuff dehiscence discussed.  Hernia formation discussed.  Positioning and incision locations discussed.  Patient aware if pathology abnormal she may need additional treatment.  All questions answered.     Ob Hx:   No LMP recorded.          Sexually active: Yes.   Birth control:  not needed Last pap: 9/202/2023, negative Last MMG: not indicated Smoking: No   No past surgical history on file.  Past Medical History:  Diagnosis Date   Anemia    Chlamydia    ECZEMA, ATOPIC DERMATITIS 12/02/2006   Qualifier: Diagnosis of  By: WHerma Ard    Gonorrhea    Migraines    OBESITY, NOS 12/02/2006   Qualifier: Diagnosis of  By: WHerma Ard    Palpitations    Sleep apnea     Allergies: Patient has no  known allergies.  Current Outpatient Medications  Medication Sig Dispense Refill   ferrous gluconate (FERGON) 324 MG tablet Take 1 tablet (324 mg total) by mouth 2 (two) times daily with a meal. 60 tablet 5   norethindrone (AYGESTIN) 5 MG tablet Take 2 tablets (10 mg total) by mouth in the morning and at bedtime. (Patient not taking: Reported on 07/29/2022) 60 tablet 0   propranolol (INDERAL) 20 MG tablet Take 1 tablet (20 mg total) by mouth 2 (two) times daily as needed. 30 tablet 1   Relugolix-Estradiol-Norethind (MYFEMBREE) 40-1-0.5 MG TABS Take 1 tablet by mouth daily in the afternoon. 28 tablet 2   No current facility-administered medications for this visit.   Facility-Administered Medications Ordered in Other Visits  Medication Dose Route Frequency Provider Last Rate Last Admin   iron sucrose (VENOFER) 200 mg in sodium chloride 0.9 % 100 mL IVPB  200 mg Intravenous Q M,W,F-HD MMegan Salon MD        ROS: Pertinent items noted in HPI and remainder of comprehensive ROS otherwise negative.  Exam:   BP (!) 125/57 (BP Location: Left Arm, Patient Position: Sitting, Cuff Size: Large)   Pulse 60   Ht '5\' 7"'$  (1.702 m) Comment: Reported  Wt 251 lb 3.2 oz (113.9 kg)   BMI 39.34 kg/m   General appearance: alert and cooperative Head: Normocephalic, without obvious abnormality, atraumatic Neck: no adenopathy, supple, symmetrical, trachea midline and thyroid not enlarged, symmetric, no tenderness/mass/nodules Lungs: clear to auscultation  bilaterally Heart: regular rate and rhythm, S1, S2 normal, no murmur, click, rub or gallop Abdomen: soft, non-tender; bowel sounds normal; no masses,  no organomegaly Extremities: extremities normal, atraumatic, no cyanosis or edema Skin: Skin color, texture, turgor normal. No rashes or lesions Lymph nodes: Cervical, supraclavicular, and axillary nodes normal. no inguinal nodes palpated Neurologic: Grossly normal  Pelvic: deferred  Assessment/Plan: 1.  Intramural and submucous leiomyoma of uterus - TLH with bilateral salpingectomy, cystoscopy is planned - Pre and post op instructions reviewed - Post op pain medications reviewed Medications/Vitamins reviewed.   2. Menorrhagia with irregular cycle - bleeding is improved at this time  3. H/o anemia  - cbc will be obtained today.  If close to hb of 10.0, will proceed with surgery as planned

## 2022-08-03 NOTE — Progress Notes (Signed)
Anesthesia Chart Review:  Follows with cardiology for history of palpitations. Echo 12/2019 LVEF 60 to 65%, no RWMA, no significant valvular normalities, normal diastolic function.  Monitor 12/14/2019 revealed probably normal sinus rhythm, frequent PVC, occasional Wenckebach.  She was set up for sleep study April 2021 noting sleep apnea and subsequently underwent titration study and was placed on BiPAP.  However BiPAP was returned to insurance company due to noncompliance. Repeat sleep study with very mild sleep apnea with supine sleep. No significant oxygen saturations. No recommendation for CPAP. Seen 05/05/2022 noting palpitations, chest pain at rest and with activity, waking up with headaches, dizziness with position changes, generalized fatigue.  A1c, thyroid panel TSH, magnesium, CMP, CBC, iron panel ordered.  She was started on metoprolol succinate 25 mg daily. Subsequently admitted 8/2 - 05/07/2022 after hemoglobin returned at 5.3. She was given 3 u PRBC and prescribed oral iron. Discharge Hb 8.4.  Last seen by cardiology Laurann Montana, NP on 05/20/2022.  She reported she continues to feel heart flutters, and felt that they were more forceful when taking metoprolol.  She stopped metoprolol reported improvement in lightheadedness and energy levels.  She was prescribed propanolol as needed for palpitations.  Preop labs reviewed, mild anemia with hemoglobin 10.8, otherwise unremarkable.  EKG 05/20/2022: Sinus bradycardia.  Rate 54.  Nonspecific T wave changes.  Event monitor 12/08/2019: 1: Sinus rhythm/sinus tachycardia/sinus bradycardia 2: Frequent PVCs 3: Occasional Wenckebach  TTE 12/05/2019:  1. Left ventricular ejection fraction, by estimation, is 60 to 65%. The  left ventricle has normal function. The left ventricle has no regional  wall motion abnormalities. Left ventricular diastolic parameters were  normal.   2. Right ventricular systolic function is normal. The right ventricular  size is  normal.   3. The mitral valve is normal in structure and function. No evidence of  mitral valve regurgitation.   4. The aortic valve is tricuspid. Aortic valve regurgitation is not  visualized.   5. The inferior vena cava is normal in size with greater than 50%  respiratory variability, suggesting right atrial pressure of 3 mmHg.   Conclusion(s)/Recommendation(s): Normal biventricular function without  evidence of hemodynamically significant valvular heart disease.    Wynonia Musty [facility] Short Stay Center/Anesthesiology Phone [phone] 08/03/2022 9:05 AM

## 2022-08-03 NOTE — Anesthesia Preprocedure Evaluation (Addendum)
Anesthesia Evaluation  Patient identified by MRN, date of birth, ID band Patient awake    Reviewed: Allergy & Precautions, NPO status , Patient's Chart, lab work & pertinent test results  Airway Mallampati: II       Dental   Pulmonary sleep apnea    breath sounds clear to auscultation       Cardiovascular  Rhythm:Regular Rate:Normal  Hx noted Dr. Nyoka Cowden   Neuro/Psych  Headaches    GI/Hepatic negative GI ROS, Neg liver ROS,,,  Endo/Other  negative endocrine ROS    Renal/GU negative Renal ROS     Musculoskeletal   Abdominal   Peds  Hematology   Anesthesia Other Findings   Reproductive/Obstetrics                             Anesthesia Physical Anesthesia Plan  ASA: 3  Anesthesia Plan: General   Post-op Pain Management: Tylenol PO (pre-op)*   Induction:   PONV Risk Score and Plan: 3 and Ondansetron, Dexamethasone and Midazolam  Airway Management Planned:   Additional Equipment:   Intra-op Plan:   Post-operative Plan: Possible Post-op intubation/ventilation  Informed Consent:      Dental advisory given  Plan Discussed with: CRNA and Anesthesiologist  Anesthesia Plan Comments: (PAT note by Karoline Caldwell, PA-C: Follows with cardiology for history of palpitations. Echo 12/2019 LVEF 60 to 65%, no RWMA, no significant valvular normalities, normal diastolic function. Monitor 12/14/2019 revealed probably normal sinus rhythm, frequent PVC, occasional Wenckebach. She was set up for sleep study April 2021 noting sleep apnea and subsequently underwent titration study and was placed on BiPAP. However BiPAP was returned to insurance company due to noncompliance. Repeat sleep study with very mild sleep apnea with supine sleep. No significant oxygen saturations. No recommendation for CPAP. Seen 05/05/2022 noting palpitations, chest pain at rest and with activity, waking up with headaches, dizziness  with position changes, generalized fatigue. A1c, thyroid panel TSH, magnesium, CMP, CBC, iron panel ordered. She was started on metoprolol succinate 25 mg daily. Subsequently admitted 8/2 - 05/07/2022 after hemoglobin returned at 5.3.She was given 3 u PRBC and prescribed oral iron. Discharge Hb 8.4. Last seen by cardiology Laurann Montana, NP on 05/20/2022.  She reported she continues to feel heart flutters, and felt that they were more forceful when taking metoprolol.  She stopped metoprolol reported improvement in lightheadedness and energy levels.  She was prescribed propanolol as needed for palpitations.  Preop labs reviewed, mild anemia with hemoglobin 10.8, otherwise unremarkable.  EKG 05/20/2022: Sinus bradycardia.  Rate 54.  Nonspecific T wave changes.  Event monitor 12/08/2019: 1: Sinus rhythm/sinus tachycardia/sinus bradycardia 2: Frequent PVCs 3: Occasional Wenckebach  TTE 12/05/2019: 1. Left ventricular ejection fraction, by estimation, is 60 to 65%. The  left ventricle has normal function. The left ventricle has no regional  wall motion abnormalities. Left ventricular diastolic parameters were  normal.  2. Right ventricular systolic function is normal. The right ventricular  size is normal.  3. The mitral valve is normal in structure and function. No evidence of  mitral valve regurgitation.  4. The aortic valve is tricuspid. Aortic valve regurgitation is not  visualized.  5. The inferior vena cava is normal in size with greater than 50%  respiratory variability, suggesting right atrial pressure of 3 mmHg.   Conclusion(s)/Recommendation(s): Normal biventricular function without  evidence of hemodynamically significant valvular heart disease. )        Anesthesia Quick Evaluation

## 2022-08-04 ENCOUNTER — Encounter (HOSPITAL_COMMUNITY): Payer: Self-pay | Admitting: Obstetrics & Gynecology

## 2022-08-04 ENCOUNTER — Ambulatory Visit (HOSPITAL_BASED_OUTPATIENT_CLINIC_OR_DEPARTMENT_OTHER): Payer: Medicaid Other | Admitting: Anesthesiology

## 2022-08-04 ENCOUNTER — Ambulatory Visit (HOSPITAL_COMMUNITY): Payer: Medicaid Other | Admitting: Physician Assistant

## 2022-08-04 ENCOUNTER — Encounter (HOSPITAL_COMMUNITY)
Admission: RE | Disposition: A | Discharge status: Home / Self Care | Payer: Self-pay | Source: Home / Self Care | Attending: Obstetrics & Gynecology

## 2022-08-04 ENCOUNTER — Ambulatory Visit (HOSPITAL_BASED_OUTPATIENT_CLINIC_OR_DEPARTMENT_OTHER): Payer: Medicaid Other | Admitting: Advanced Practice Midwife

## 2022-08-04 ENCOUNTER — Other Ambulatory Visit: Payer: Self-pay

## 2022-08-04 ENCOUNTER — Ambulatory Visit (HOSPITAL_COMMUNITY)
Admission: RE | Admit: 2022-08-04 | Discharge: 2022-08-04 | Disposition: A | Discharge status: Home / Self Care | Payer: Medicaid Other | Attending: Obstetrics & Gynecology | Admitting: Obstetrics & Gynecology

## 2022-08-04 DIAGNOSIS — D250 Submucous leiomyoma of uterus: Secondary | ICD-10-CM

## 2022-08-04 DIAGNOSIS — G4730 Sleep apnea, unspecified: Secondary | ICD-10-CM | POA: Insufficient documentation

## 2022-08-04 DIAGNOSIS — N840 Polyp of corpus uteri: Secondary | ICD-10-CM | POA: Diagnosis not present

## 2022-08-04 DIAGNOSIS — D500 Iron deficiency anemia secondary to blood loss (chronic): Secondary | ICD-10-CM

## 2022-08-04 DIAGNOSIS — D259 Leiomyoma of uterus, unspecified: Secondary | ICD-10-CM

## 2022-08-04 DIAGNOSIS — N921 Excessive and frequent menstruation with irregular cycle: Secondary | ICD-10-CM | POA: Diagnosis not present

## 2022-08-04 DIAGNOSIS — N920 Excessive and frequent menstruation with regular cycle: Secondary | ICD-10-CM | POA: Diagnosis present

## 2022-08-04 DIAGNOSIS — D252 Subserosal leiomyoma of uterus: Secondary | ICD-10-CM | POA: Diagnosis not present

## 2022-08-04 DIAGNOSIS — D251 Intramural leiomyoma of uterus: Secondary | ICD-10-CM | POA: Diagnosis present

## 2022-08-04 DIAGNOSIS — Z01818 Encounter for other preprocedural examination: Secondary | ICD-10-CM

## 2022-08-04 HISTORY — PX: TOTAL LAPAROSCOPIC HYSTERECTOMY WITH SALPINGECTOMY: SHX6742

## 2022-08-04 HISTORY — PX: CYSTOSCOPY: SHX5120

## 2022-08-04 LAB — POCT PREGNANCY, URINE: Preg Test, Ur: NEGATIVE

## 2022-08-04 LAB — HEMOGLOBIN: Hemoglobin: 11.2 g/dL — ABNORMAL LOW (ref 12.0–15.0)

## 2022-08-04 SURGERY — HYSTERECTOMY, TOTAL, LAPAROSCOPIC, WITH SALPINGECTOMY
Anesthesia: General | Site: Uterus

## 2022-08-04 MED ORDER — ACETAMINOPHEN 500 MG PO TABS
ORAL_TABLET | ORAL | Status: AC
  Administered 2022-08-04: 1000 mg via ORAL
  Filled 2022-08-04: qty 2

## 2022-08-04 MED ORDER — ONDANSETRON HCL 4 MG PO TABS: 4.0000 mg | ORAL_TABLET | Freq: Four times a day (QID) | ORAL | Status: DC | PRN

## 2022-08-04 MED ORDER — CHLORHEXIDINE GLUCONATE 0.12 % MT SOLN: 15.0000 mL | Freq: Once | OROMUCOSAL | Status: AC

## 2022-08-04 MED ORDER — SUGAMMADEX SODIUM 200 MG/2ML IV SOLN
INTRAVENOUS | Status: DC | PRN
  Administered 2022-08-04: 200 mg via INTRAVENOUS

## 2022-08-04 MED ORDER — PHENYLEPHRINE 80 MCG/ML (10ML) SYRINGE FOR IV PUSH (FOR BLOOD PRESSURE SUPPORT)
PREFILLED_SYRINGE | INTRAVENOUS | Status: DC | PRN
  Administered 2022-08-04 (×10): 80 ug via INTRAVENOUS

## 2022-08-04 MED ORDER — MENTHOL 3 MG MT LOZG: 1.0000 | LOZENGE | OROMUCOSAL | Status: DC | PRN

## 2022-08-04 MED ORDER — DEXTROSE-NACL 5-0.45 % IV SOLN: INTRAVENOUS | Status: DC

## 2022-08-04 MED ORDER — SODIUM CHLORIDE 0.9 % IR SOLN
Status: DC | PRN
  Administered 2022-08-04: 1000 mL via INTRAVESICAL
  Administered 2022-08-04: 1000 mL

## 2022-08-04 MED ORDER — ONDANSETRON HCL 4 MG/2ML IJ SOLN: 4.0000 mg | Freq: Four times a day (QID) | INTRAMUSCULAR | Status: DC | PRN

## 2022-08-04 MED ORDER — SUCCINYLCHOLINE CHLORIDE 200 MG/10ML IV SOSY
PREFILLED_SYRINGE | INTRAVENOUS | Status: DC | PRN
  Administered 2022-08-04: 140 mg via INTRAVENOUS
  Administered 2022-08-04: 30 mg via INTRAVENOUS
  Administered 2022-08-04: 25 mg via INTRAVENOUS

## 2022-08-04 MED ORDER — BUPIVACAINE HCL (PF) 0.25 % IJ SOLN
INTRAMUSCULAR | Status: AC
  Filled 2022-08-04: qty 30

## 2022-08-04 MED ORDER — DEXAMETHASONE SODIUM PHOSPHATE 10 MG/ML IJ SOLN
INTRAMUSCULAR | Status: DC | PRN
  Administered 2022-08-04: 10 mg via INTRAVENOUS

## 2022-08-04 MED ORDER — FENTANYL CITRATE (PF) 250 MCG/5ML IJ SOLN
INTRAMUSCULAR | Status: AC
  Filled 2022-08-04: qty 5

## 2022-08-04 MED ORDER — PANTOPRAZOLE SODIUM 40 MG IV SOLR: 40.0000 mg | Freq: Every day | INTRAVENOUS | Status: DC

## 2022-08-04 MED ORDER — KETOROLAC TROMETHAMINE 30 MG/ML IJ SOLN
INTRAMUSCULAR | Status: AC
  Filled 2022-08-04: qty 1

## 2022-08-04 MED ORDER — ORAL CARE MOUTH RINSE: 15.0000 mL | Freq: Once | OROMUCOSAL | Status: AC

## 2022-08-04 MED ORDER — SODIUM CHLORIDE 0.9 % IV SOLN
INTRAVENOUS | Status: DC | PRN
  Administered 2022-08-04: 60 mL

## 2022-08-04 MED ORDER — MIDAZOLAM HCL 2 MG/2ML IJ SOLN
INTRAMUSCULAR | Status: AC
  Filled 2022-08-04: qty 2

## 2022-08-04 MED ORDER — HYDROMORPHONE HCL 1 MG/ML IJ SOLN
INTRAMUSCULAR | Status: AC
  Filled 2022-08-04: qty 1

## 2022-08-04 MED ORDER — IBUPROFEN 600 MG PO TABS: 600.0000 mg | ORAL_TABLET | Freq: Four times a day (QID) | ORAL | Status: DC

## 2022-08-04 MED ORDER — PROPOFOL 10 MG/ML IV BOLUS
INTRAVENOUS | Status: AC
  Filled 2022-08-04: qty 20

## 2022-08-04 MED ORDER — BUPIVACAINE HCL (PF) 0.25 % IJ SOLN
INTRAMUSCULAR | Status: DC | PRN
  Administered 2022-08-04: 18 mL

## 2022-08-04 MED ORDER — MORPHINE SULFATE (PF) 2 MG/ML IV SOLN: 1.0000 mg | INTRAVENOUS | Status: DC | PRN

## 2022-08-04 MED ORDER — LIDOCAINE 2% (20 MG/ML) 5 ML SYRINGE
INTRAMUSCULAR | Status: DC | PRN
  Administered 2022-08-04: 100 mg via INTRAVENOUS

## 2022-08-04 MED ORDER — ONDANSETRON HCL 4 MG/2ML IJ SOLN
INTRAMUSCULAR | Status: DC | PRN
  Administered 2022-08-04: 4 mg via INTRAVENOUS

## 2022-08-04 MED ORDER — KETOROLAC TROMETHAMINE 30 MG/ML IJ SOLN
30.0000 mg | Freq: Four times a day (QID) | INTRAMUSCULAR | Status: DC
  Administered 2022-08-04: 30 mg via INTRAVENOUS

## 2022-08-04 MED ORDER — 0.9 % SODIUM CHLORIDE (POUR BTL) OPTIME
TOPICAL | Status: DC | PRN
  Administered 2022-08-04: 1000 mL

## 2022-08-04 MED ORDER — ALUM & MAG HYDROXIDE-SIMETH 200-200-20 MG/5ML PO SUSP: 30.0000 mL | ORAL | Status: DC | PRN

## 2022-08-04 MED ORDER — LACTATED RINGERS IV SOLN: INTRAVENOUS | Status: DC

## 2022-08-04 MED ORDER — EPHEDRINE SULFATE-NACL 50-0.9 MG/10ML-% IV SOSY
PREFILLED_SYRINGE | INTRAVENOUS | Status: DC | PRN
  Administered 2022-08-04: 5 mg via INTRAVENOUS

## 2022-08-04 MED ORDER — OXYCODONE-ACETAMINOPHEN 5-325 MG PO TABS: 1.0000 | ORAL_TABLET | ORAL | Status: DC | PRN

## 2022-08-04 MED ORDER — PHENYLEPHRINE HCL-NACL 20-0.9 MG/250ML-% IV SOLN
INTRAVENOUS | Status: DC | PRN
  Administered 2022-08-04: 15 ug/min via INTRAVENOUS

## 2022-08-04 MED ORDER — SODIUM CHLORIDE (PF) 0.9 % IJ SOLN
INTRAMUSCULAR | Status: AC
  Filled 2022-08-04: qty 100

## 2022-08-04 MED ORDER — SODIUM CHLORIDE 0.9 % IV SOLN
2.0000 g | INTRAVENOUS | Status: AC
  Administered 2022-08-04: 2 g via INTRAVENOUS

## 2022-08-04 MED ORDER — GABAPENTIN 100 MG PO CAPS
100.0000 mg | ORAL_CAPSULE | Freq: Three times a day (TID) | ORAL | Status: DC
  Administered 2022-08-04: 100 mg via ORAL
  Filled 2022-08-04: qty 1

## 2022-08-04 MED ORDER — SODIUM CHLORIDE 0.9 % IV SOLN
INTRAVENOUS | Status: AC
  Filled 2022-08-04: qty 2

## 2022-08-04 MED ORDER — ROCURONIUM BROMIDE 10 MG/ML (PF) SYRINGE
PREFILLED_SYRINGE | INTRAVENOUS | Status: AC
  Filled 2022-08-04: qty 10

## 2022-08-04 MED ORDER — FENTANYL CITRATE (PF) 250 MCG/5ML IJ SOLN
INTRAMUSCULAR | Status: DC | PRN
  Administered 2022-08-04: 100 ug via INTRAVENOUS
  Administered 2022-08-04: 50 ug via INTRAVENOUS

## 2022-08-04 MED ORDER — OXYCODONE-ACETAMINOPHEN 5-325 MG PO TABS: 1.0000 | ORAL_TABLET | Freq: Four times a day (QID) | ORAL | 0 refills | Status: AC | PRN

## 2022-08-04 MED ORDER — ROCURONIUM BROMIDE 10 MG/ML (PF) SYRINGE
PREFILLED_SYRINGE | INTRAVENOUS | Status: DC | PRN
  Administered 2022-08-04: 20 mg via INTRAVENOUS
  Administered 2022-08-04: 70 mg via INTRAVENOUS
  Administered 2022-08-04: 30 mg via INTRAVENOUS

## 2022-08-04 MED ORDER — POVIDONE-IODINE 10 % EX SWAB
2.0000 | Freq: Once | CUTANEOUS | Status: AC
  Administered 2022-08-04: 2 via TOPICAL

## 2022-08-04 MED ORDER — PROPOFOL 10 MG/ML IV BOLUS
INTRAVENOUS | Status: DC | PRN
  Administered 2022-08-04: 150 mg via INTRAVENOUS

## 2022-08-04 MED ORDER — SIMETHICONE 80 MG PO CHEW: 80.0000 mg | CHEWABLE_TABLET | Freq: Four times a day (QID) | ORAL | Status: DC | PRN

## 2022-08-04 MED ORDER — HYDROMORPHONE HCL 1 MG/ML IJ SOLN
0.2500 mg | INTRAMUSCULAR | Status: DC | PRN
  Administered 2022-08-04 (×2): 0.5 mg via INTRAVENOUS

## 2022-08-04 MED ORDER — CHLORHEXIDINE GLUCONATE 0.12 % MT SOLN
OROMUCOSAL | Status: AC
  Administered 2022-08-04: 15 mL via OROMUCOSAL
  Filled 2022-08-04: qty 15

## 2022-08-04 MED ORDER — MIDAZOLAM HCL 2 MG/2ML IJ SOLN
INTRAMUSCULAR | Status: DC | PRN
  Administered 2022-08-04: 2 mg via INTRAVENOUS

## 2022-08-04 MED ORDER — ACETAMINOPHEN 500 MG PO TABS: 1000.0000 mg | ORAL_TABLET | ORAL | Status: AC

## 2022-08-04 MED ORDER — IBUPROFEN 800 MG PO TABS: 800.0000 mg | ORAL_TABLET | Freq: Three times a day (TID) | ORAL | 0 refills | Status: DC | PRN

## 2022-08-04 MED ORDER — ROPIVACAINE HCL 5 MG/ML IJ SOLN
INTRAMUSCULAR | Status: AC
  Filled 2022-08-04: qty 30

## 2022-08-04 SURGICAL SUPPLY — 55 items
ADH SKN CLS APL DERMABOND .7 (GAUZE/BANDAGES/DRESSINGS) ×2
APL SRG 38 LTWT LNG FL B (MISCELLANEOUS) ×2
APL SWBSTK 6 STRL LF DISP (MISCELLANEOUS) ×2
APPLICATOR ARISTA FLEXITIP XL (MISCELLANEOUS) IMPLANT
APPLICATOR COTTON TIP 6 STRL (MISCELLANEOUS) IMPLANT
APPLICATOR COTTON TIP 6IN STRL (MISCELLANEOUS) ×2
CABLE HIGH FREQUENCY MONO STRZ (ELECTRODE) ×3 IMPLANT
COVER MAYO STAND STRL (DRAPES) ×3 IMPLANT
COVER SURGICAL LIGHT HANDLE (MISCELLANEOUS) ×3 IMPLANT
DEFOGGER SCOPE WARMER CLEARIFY (MISCELLANEOUS) IMPLANT
DERMABOND ADVANCED .7 DNX12 (GAUZE/BANDAGES/DRESSINGS) ×3 IMPLANT
DURAPREP 26ML APPLICATOR (WOUND CARE) ×3 IMPLANT
GLOVE ECLIPSE 6.5 STRL STRAW (GLOVE) ×6 IMPLANT
GLOVE SURG UNDER POLY LF SZ7 (GLOVE) ×12 IMPLANT
GOWN STRL REUS W/ TWL LRG LVL3 (GOWN DISPOSABLE) ×12 IMPLANT
GOWN STRL REUS W/TWL LRG LVL3 (GOWN DISPOSABLE) ×10
HEMOSTAT ARISTA ABSORB 3G PWDR (HEMOSTASIS) IMPLANT
HIBICLENS CHG 4% 4OZ BTL (MISCELLANEOUS) ×3 IMPLANT
IRRIG SUCT STRYKERFLOW 2 WTIP (MISCELLANEOUS) ×2
IRRIGATION SUCT STRKRFLW 2 WTP (MISCELLANEOUS) ×3 IMPLANT
KIT TURNOVER KIT B (KITS) ×3 IMPLANT
LIGASURE VESSEL 5MM BLUNT TIP (ELECTROSURGICAL) ×3 IMPLANT
NDL INSUFFLATION 14GA 120MM (NEEDLE) ×3 IMPLANT
NEEDLE INSUFFLATION 14GA 120MM (NEEDLE) ×2 IMPLANT
NS IRRIG 1000ML POUR BTL (IV SOLUTION) ×3 IMPLANT
OCCLUDER COLPOPNEUMO (BALLOONS) ×3 IMPLANT
PACK LAPAROSCOPY BASIN (CUSTOM PROCEDURE TRAY) ×3 IMPLANT
PACK TRENDGUARD 450 HYBRID PRO (MISCELLANEOUS) IMPLANT
POUCH LAPAROSCOPIC INSTRUMENT (MISCELLANEOUS) ×3 IMPLANT
PROTECTOR NERVE ULNAR (MISCELLANEOUS) ×6 IMPLANT
SCISSORS LAP 5X35 DISP (ENDOMECHANICALS) ×3 IMPLANT
SET CYSTO W/LG BORE CLAMP LF (SET/KITS/TRAYS/PACK) ×3 IMPLANT
SET TRI-LUMEN FLTR TB AIRSEAL (TUBING) ×3 IMPLANT
SET TUBE SMOKE EVAC HIGH FLOW (TUBING) ×3 IMPLANT
SHEARS HARMONIC ACE PLUS 36CM (ENDOMECHANICALS) ×3 IMPLANT
SUT VIC AB 0 CT1 27 (SUTURE) ×4
SUT VIC AB 0 CT1 27XBRD ANBCTR (SUTURE) ×6 IMPLANT
SUT VICRYL 4-0 PS2 18IN ABS (SUTURE) ×6 IMPLANT
SUT VLOC 180 0 9IN  GS21 (SUTURE) ×2
SUT VLOC 180 0 9IN GS21 (SUTURE) ×3 IMPLANT
SYR 10ML LL (SYRINGE) ×3 IMPLANT
SYR 50ML LL SCALE MARK (SYRINGE) ×6 IMPLANT
TIP UTERINE 5.1X6CM LAV DISP (MISCELLANEOUS) IMPLANT
TIP UTERINE 6.7X10CM GRN DISP (MISCELLANEOUS) IMPLANT
TIP UTERINE 6.7X6CM WHT DISP (MISCELLANEOUS) IMPLANT
TIP UTERINE 6.7X8CM BLUE DISP (MISCELLANEOUS) IMPLANT
TOWEL GREEN STERILE FF (TOWEL DISPOSABLE) ×6 IMPLANT
TRAY FOLEY W/BAG SLVR 14FR (SET/KITS/TRAYS/PACK) ×3 IMPLANT
TRENDGUARD 450 HYBRID PRO PACK (MISCELLANEOUS) ×2
TROCAR ADV FIXATION 5X100MM (TROCAR) ×3 IMPLANT
TROCAR PORT AIRSEAL 5X120 (TROCAR) ×3 IMPLANT
TROCAR XCEL NON BLADE 8MM B8LT (ENDOMECHANICALS) ×3 IMPLANT
TROCAR XCEL NON-BLD 5MMX100MML (ENDOMECHANICALS) ×3 IMPLANT
UNDERPAD 30X36 HEAVY ABSORB (UNDERPADS AND DIAPERS) ×3 IMPLANT
WARMER LAPAROSCOPE (MISCELLANEOUS) ×3 IMPLANT

## 2022-08-04 NOTE — H&P (Signed)
Julia Roberts is an 36 y.o. female.G1 P1 AA female with hx of menorrhagia and enlarged fibroid uterus here for definitive treatment of her bleeding with TLH/bilateral salpingectomy/possible oophorectomy, and cystoscopy.  Procedure, risks and benefits have been reviewed.  Pre-op evaluation thus far has included utrasound with uterus measuring 12 x 1 x 9cm measuring 663m.  Multiple fibroids noted with several submucosal ones present. She has been treated with several iron infusions for treatment of her anemia.  Her pre op hb is 10.8.  Pertinent Gynecological History: Menses:  heavy and irregular Bleeding: heavy Contraception:  not needed as in same sex relationship DES exposure: denies Blood transfusions: none Sexually transmitted diseases: no past history Previous GYN Procedures:  NSVD x 1   Last mammogram: n/a Last pap: normal Date: 06/2022 OB History: G1, P1   Menstrual History: Patient's last menstrual period was 07/07/2022 (within weeks).    Past Medical History:  Diagnosis Date   Anemia    Chlamydia    ECZEMA, ATOPIC DERMATITIS 12/02/2006   Qualifier: Diagnosis of  By: WHerma Ard    Gonorrhea    Migraines    OBESITY, NOS 12/02/2006   Qualifier: Diagnosis of  By: WHerma Ard    Palpitations    Sleep apnea     History reviewed. No pertinent surgical history.  Family History  Problem Relation Age of Onset   Migraines Mother    Hypertension Father    Diabetes Maternal Aunt     Social History:  reports that she has never smoked. She has never used smokeless tobacco. She reports that she does not drink alcohol and does not use drugs.  Allergies: No Known Allergies  Medications Prior to Admission  Medication Sig Dispense Refill Last Dose   ferrous gluconate (FERGON) 324 MG tablet Take 1 tablet (324 mg total) by mouth 2 (two) times daily with a meal. 60 tablet 5 08/03/2022   propranolol (INDERAL) 20 MG tablet Take 1 tablet (20 mg total) by mouth 2 (two)  times daily as needed. 30 tablet 1 07/31/2022   Relugolix-Estradiol-Norethind (MYFEMBREE) 40-1-0.5 MG TABS Take 1 tablet by mouth daily in the afternoon. 28 tablet 2 08/03/2022   norethindrone (AYGESTIN) 5 MG tablet Take 2 tablets (10 mg total) by mouth in the morning and at bedtime. (Patient not taking: Reported on 07/29/2022) 60 tablet 0 Not Taking    Review of Systems  All other systems reviewed and are negative.   Blood pressure 120/63, pulse 67, temperature 98.6 F (37 C), temperature source Oral, resp. rate 18, height '5\' 7"'$  (1.702 m), last menstrual period 07/07/2022, SpO2 99 %. Physical Exam Constitutional:      Appearance: Normal appearance.  Cardiovascular:     Rate and Rhythm: Normal rate and regular rhythm.  Pulmonary:     Effort: Pulmonary effort is normal.     Breath sounds: Normal breath sounds.  Neurological:     General: No focal deficit present.     Mental Status: She is alert.  Psychiatric:        Mood and Affect: Mood normal.     Results for orders placed or performed during the hospital encounter of 08/04/22 (from the past 24 hour(s))  Pregnancy, urine POC     Status: None   Collection Time: 08/04/22  7:53 AM  Result Value Ref Range   Preg Test, Ur NEGATIVE NEGATIVE    No results found.  Assessment/Plan: 36yo G1P1 AAF with hx of menorrhagia, iron deficiency anemia, fibroids  here for definitive treatment with TLH/bilateral salpingectomy/cystoscopy.  Questions answered.  Pt ready to proceed.  Megan Salon 08/04/2022, 8:46 AM

## 2022-08-04 NOTE — Discharge Instructions (Signed)

## 2022-08-04 NOTE — Progress Notes (Signed)
Pt admitted to 6N01 from PACU in wheelchair, alert

## 2022-08-04 NOTE — Anesthesia Postprocedure Evaluation (Signed)
Anesthesia Post Note  Patient: Julia Roberts  Procedure(s) Performed: TOTAL LAPAROSCOPIC HYSTERECTOMY WITH BILATERAL SALPINGECTOMY (Bilateral: Uterus) CYSTOSCOPY (Bladder)     Patient location during evaluation: PACU Anesthesia Type: General Level of consciousness: awake Pain management: pain level controlled Vital Signs Assessment: post-procedure vital signs reviewed and stable Respiratory status: spontaneous breathing Cardiovascular status: stable Postop Assessment: no apparent nausea or vomiting Anesthetic complications: no   No notable events documented.  Last Vitals:  Vitals:   08/04/22 1200 08/04/22 1215  BP: 113/70 121/70  Pulse: 70 66  Resp: 19 12  Temp:    SpO2: 100% 100%    Last Pain:  Vitals:   08/04/22 1219  TempSrc:   PainSc: 7                  Keyontay Stolz

## 2022-08-04 NOTE — Op Note (Addendum)
08/04/2022  11:49 AM  PATIENT:  Julia Roberts  36 y.o. female  PRE-OPERATIVE DIAGNOSIS:  MENORRHAGIA, FIBROIDS  POST-OPERATIVE DIAGNOSIS:  MENORRHAGIA, FIBROIDS  PROCEDURE:  Procedure(s): TOTAL LAPAROSCOPIC HYSTERECTOMY WITH BILATERAL SALPINGECTOMY CYSTOSCOPY  SURGEON:  Megan Salon  ASSISTANTS: Aletha Halim, [place].  An experienced assistant was required given the standard of surgical care given the complexity of the case.  This assistant was needed for exposure, dissection, suctioning, retraction, instrument exchange and for overall help during the procedure.  RNFA help was also unavailable.  ANESTHESIA:   general  ESTIMATED BLOOD LOSS: 100 mL  BLOOD ADMINISTERED:none   FLUIDS: 1000cc LR  UOP: 400cc clear UOP  SPECIMEN:  uterus, cervix and bilateral fallopian tubes.  Uterus weighed 500grams in OR post op.  DISPOSITION OF SPECIMEN:  PATHOLOGY  FINDINGS: enlarged, globular uterus, normal ovaries and fallopian tubes and normal upper abdomen  DESCRIPTION OF OPERATION: Patient is taken to the operating room. She is placed in the supine position. She is a running IV in place. Informed consent was present on the chart. SCDs on her lower extremities and functioning properly. Patient was positioned while she was awake.  Her legs were placed in the low lithotomy position in [place]. Her arms were tucked by the side.  General endotracheal anesthesia was administered by the anesthesia staff without difficulty. Dr. Nyoka Cowden, anesthesia, oversaw case.  Time out performed.    Clora prep was then used to prep the abdomen and Hibiclens was used to prep the inner thighs, perineum and vagina. Once 3 minutes had past the patient was draped in a normal standard fashion. The legs were lifted to the high lithotomy position. The cervix was visualized by placing a heavy weighted speculum in the posterior aspect of the vagina and using a curved Deaver retractor to the retract anteriorly. The  anterior lip of the cervix was grasped with single-tooth tenaculum.  The cervix sounded to 9 cm. Pratt dilators were used to dilate the cervix up to a #21. A RUMI uterine manipulator was obtained. A #8 disposable tip was placed on the RUMI manipulator as well as a 3.5, silver KOH ring. This was passed through the cervix and the bulb of the disposable tip was inflated with 10 cc of normal saline. There was a good fit of the KOH ring around the cervix. The tenaculum was removed. There is also good manipulation of the uterus. The speculum and retractor were removed as well. A Foley catheter was placed to straight drain.  Clear urine was noted. Legs were lowered to the low lithotomy position and attention was turned the abdomen.  The umbilicus was everted.  Marcaine 0.25% used to anesthetize the skin.  Using #11 blade, 73m skin incision was made.  A Veress needle was obtained. Syringe of sterile saline was placed on a open Veress needle.  With the abdomen elevated, the Veress needle was passed into the umbilicus until the pop was heard and then fluid started to drip.  Then low flow CO2 gas was attached the needle and the pneumoperitoneum was achieved without difficulty. Once four liters of gas was in the abdomen the Veress needle was removed and a 5 millimeter non-bladed Optiview trocar and port were passed directly to the abdomen. The laparoscope was then used to confirm intraperitoneal placement. Findings included enlarge globular uterus, normal ovaries and normal fallopian tubes.  Locations for RLQ, LLQ, and suprapubic ports were noted by transillumination of the abdominal wall.  0.25% marcaine was used to  anesthetize the skin.  89m skin incision was made in the RLQ and an AirSeal port was placed underdirect visualization of the laparoscope.  Then a 510mskin incision was made and a 88m79monbladed trochar and port was placed in the LLQ.  Finally, and 8mm20min incision was made about 4cm above the pubic symphasis and  an 8mm 788m-bladed port was placed with direct visualization of the laparoscope.  All trochars were removed.    Ureters were identifies.  Attention was turned to the left side. With uterus on stretch the left tube was excised off the ovary and mesosalpinx was dissected to free the tube. Then the left utero-ovarian pedicle was serially clamped cauterized and incised using the ligasure device. Left round ligament was serially clamped cauterized and incised. The anterior and posterior peritoneum of the inferior leaf of the broad ligament were opened. The beginning of the bladder flap was created.  The bladder was taken down below the level of the KOH ring. The left uterine artery skeletonized and then just superior to the KOH ring this vessel was serially clamped, cauterized, and incised.  Attention was turned the right side.  The uterus was placed on stretch to the opposite side.  The tube was excised off the ovary using sharp dissection a bipolar cautery.  The mesosalpinx was incised freeing the tube. Then the right uterine ovarian pedicle was serially clamped cauterized and incised. Next the right round ligament was serially clamped cauterized and incised. The anterior posterior peritoneum of the inferiorly for the broad ligament were opened. The anterior peritoneum was carried across to the dissection on the left side. The remainder of the bladder flap was created using sharp dissection. The bladder was well below the level of the KOH ring. The left uterine artery skeletonized. Then the left uterine artery, above the level of the KOH ring, was serially clamped cauterized and incised. The uterus was devascularized at this point.  The colpotomy was performed a starting in the midline and using a harmonic scalpel with the inferior edge of the open blade  This was carried around a circumferential fashion until the vaginal mucosa was completely incised in the specimen was freed.  The specimen was then delivered to  the vagina.  A vaginal occlusive device was used to maintain the pneumoperitoneum  Instruments were changed with a needle driver and Kobra graspers.  Using a 9 inch V. lock suture, the cuff was closed by incorporating the anterior and posterior vaginal mucosa in each stitch. This was carried across all the way to the left corner and a running fashion. Two stitches were brought back towards the midline and the suture was cut flush with the vagina. The needle was brought out the pelvis. The pelvis was irrigated. All pedicles were inspected. No bleeding was noted.   Co2 pressures were lowered to 8mm H26m Again, no bleeding was noted.  Ureters were noted deep in the pelvis to be peristalsing.  Arista was placed along the pedicles.  At this point the procedure was completed.  The remaining instruments were removed.  The ports (except the suprapubic port) were removed under direct visualization of the laparoscope and the pneumoperitoneum was relieved.  The patient was taken out of Trendelenburg positioning.  Several deep breaths were given to the patient's trying to any gas the abdomen and finally the suprapubic port was removed.  The skin was then closed with subcuticular stitches of 3-0 Vicryl. The skin was cleansed Dermabond was applied. Attention was  then turned the vagina and the cuff was inspected. No bleeding was noted. The anterior posterior vaginal mucosa was incorporated in each stitch. The Foley catheter was removed.  Cystoscopy was performed.  No sutures or bladder injuries were noted.  Ureters were noted with normal urine jets from each one was seen.  Foley was left out after the cystoscopic fluid was drained and cystoscope removed.  Sponge, lap, needle, instrument counts were correct x2. Patient tolerated the procedure very well. She was awakened from anesthesia, extubated and taken to recovery in stable condition.   COUNTS:  YES  PLAN OF CARE: Transfer to PACU

## 2022-08-04 NOTE — Transfer of Care (Signed)
Immediate Anesthesia Transfer of Care Note  Patient: Julia Roberts  Procedure(s) Performed: TOTAL LAPAROSCOPIC HYSTERECTOMY WITH BILATERAL SALPINGECTOMY (Bilateral: Uterus) CYSTOSCOPY (Bladder)  Patient Location: PACU  Anesthesia Type:General  Level of Consciousness: drowsy, patient cooperative, and responds to stimulation  Airway & Oxygen Therapy: Patient Spontanous Breathing and Patient connected to nasal cannula oxygen  Post-op Assessment: Report given to RN  Post vital signs: Reviewed and stable  Last Vitals:  Vitals Value Taken Time  BP 84/70 08/04/22 1200  Temp    Pulse 69 08/04/22 1201  Resp 17 08/04/22 1201  SpO2 100 % 08/04/22 1201  Vitals shown include unvalidated device data.  Last Pain:  Vitals:   08/04/22 0744  TempSrc:   PainSc: 0-No pain         Complications: No notable events documented.

## 2022-08-04 NOTE — Anesthesia Procedure Notes (Signed)
Procedure Name: Intubation Date/Time: 08/04/2022 9:20 AM  Performed by: Michele Rockers, CRNAPre-anesthesia Checklist: Patient identified, Patient being monitored, Timeout performed, Emergency Drugs available and Suction available Patient Re-evaluated:Patient Re-evaluated prior to induction Oxygen Delivery Method: Circle System Utilized Preoxygenation: Pre-oxygenation with 100% oxygen Induction Type: IV induction Ventilation: Mask ventilation without difficulty Laryngoscope Size: Miller and 2 Grade View: Grade I Tube type: Oral Tube size: 7.0 mm Number of attempts: 1 Airway Equipment and Method: Stylet Placement Confirmation: ETT inserted through vocal cords under direct vision, positive ETCO2 and breath sounds checked- equal and bilateral Secured at: 21 cm Tube secured with: Tape Dental Injury: Teeth and Oropharynx as per pre-operative assessment

## 2022-08-04 NOTE — Progress Notes (Signed)
Day of Surgery Procedure(s) (LRB): TOTAL LAPAROSCOPIC HYSTERECTOMY WITH BILATERAL SALPINGECTOMY (Bilateral) CYSTOSCOPY (N/A)  Subjective: Patient reports no complaints.  No nausea.  Eating.  Walking.  Would like to go home.  Objective: I have reviewed patient's vital signs, intake and output, medications, and labs. Vitals:   08/04/22 1357 08/04/22 1417  BP: 133/66 119/61  Pulse: 77 64  Resp: 16 17  Temp: 97.9 F (36.6 C) 97.6 F (36.4 C)  SpO2: 100% 100%    General: cooperative Resp: clear to auscultation bilaterally Cardio: regular rate and rhythm, S1, S2 normal, no murmur, click, rub or gallop GI: soft, non-tender; bowel sounds normal; no masses,  no organomegaly Extremities: extremities normal, atraumatic, no cyanosis or edema Vaginal Bleeding: minimal  Assessment: s/p Procedure(s): TOTAL LAPAROSCOPIC HYSTERECTOMY WITH BILATERAL SALPINGECTOMY (Bilateral) CYSTOSCOPY (N/A): progressing well  Plan: Discharge home  LOS: 0 days    Megan Salon, MD 08/04/2022, 7:09 PM

## 2022-08-05 ENCOUNTER — Encounter (HOSPITAL_COMMUNITY): Payer: Self-pay | Admitting: Obstetrics & Gynecology

## 2022-08-05 LAB — SURGICAL PATHOLOGY

## 2022-08-07 ENCOUNTER — Ambulatory Visit (HOSPITAL_BASED_OUTPATIENT_CLINIC_OR_DEPARTMENT_OTHER): Payer: Medicaid Other

## 2022-08-11 ENCOUNTER — Encounter (HOSPITAL_BASED_OUTPATIENT_CLINIC_OR_DEPARTMENT_OTHER): Payer: Self-pay | Admitting: Obstetrics & Gynecology

## 2022-08-11 ENCOUNTER — Ambulatory Visit (INDEPENDENT_AMBULATORY_CARE_PROVIDER_SITE_OTHER): Payer: Medicaid Other | Admitting: Obstetrics & Gynecology

## 2022-08-11 VITALS — BP 118/54 | HR 69 | Ht 67.0 in | Wt 247.0 lb

## 2022-08-11 DIAGNOSIS — Z9889 Other specified postprocedural states: Secondary | ICD-10-CM

## 2022-08-12 ENCOUNTER — Encounter (HOSPITAL_BASED_OUTPATIENT_CLINIC_OR_DEPARTMENT_OTHER): Payer: Self-pay | Admitting: Obstetrics & Gynecology

## 2022-08-12 NOTE — Progress Notes (Signed)
GYNECOLOGY  VISIT  CC:   post op recheck  HPI: 36 y.o. [id number] Single Black or African American female here for recheck after undergoing TLH/bilateral salpingotomy, cystoscopy on 10/31.  She reports bleeding is none.  She has minimal pain.  She is taking motrin in the day and only percocet at night.  Bowel function is Normal.  Bladder function is normal.    Pathology reviewed:  Yes .  Questions answered.    MEDS:   Current Outpatient Medications on File Prior to Visit  Medication Sig Dispense Refill   ferrous gluconate (FERGON) 324 MG tablet Take 1 tablet (324 mg total) by mouth 2 (two) times daily with a meal. 60 tablet 5   ibuprofen (ADVIL) 800 MG tablet Take 1 tablet (800 mg total) by mouth every 8 (eight) hours as needed. 30 tablet 0   propranolol (INDERAL) 20 MG tablet Take 1 tablet (20 mg total) by mouth 2 (two) times daily as needed. 30 tablet 1   Current Facility-Administered Medications on File Prior to Visit  Medication Dose Route Frequency Provider Last Rate Last Admin   iron sucrose (VENOFER) 200 mg in sodium chloride 0.9 % 100 mL IVPB  200 mg Intravenous Q M,W,F-HD Megan Salon, MD        SH:  Smoking No    PHYSICAL EXAMINATION:    BP (!) 118/54 (BP Location: Right Arm, Patient Position: Sitting, Cuff Size: Large)   Pulse 69   Ht '5\' 7"'$  (1.702 m) Comment: Reported  Wt 247 lb (112 kg)   LMP 07/07/2022 (Within Weeks) Comment: DOS UPREG NEGATIVE  BMI 38.69 kg/m     General appearance: alert, cooperative and appears stated age Abdomen: soft, non-tender; bowel sounds normal; no masses,  no organomegaly Incisions:  C/D/I  Assessment/Plan: 1. Post-operative state - do's and don't's discussed.  Pt has follow up in 4 weeks.  Is thinking about returning to work in three.  Will let me know if needs any paperwork for this.  Lifting restrictions discussed.

## 2022-08-20 ENCOUNTER — Telehealth: Payer: Self-pay | Admitting: Emergency Medicine

## 2022-08-20 NOTE — Telephone Encounter (Signed)
Patient contacted and confirmed upcoming New patient appt for 11/28

## 2022-09-01 ENCOUNTER — Ambulatory Visit: Payer: Medicaid Other | Admitting: Family Medicine

## 2022-09-09 ENCOUNTER — Encounter (HOSPITAL_BASED_OUTPATIENT_CLINIC_OR_DEPARTMENT_OTHER): Payer: Self-pay | Admitting: Obstetrics & Gynecology

## 2022-09-09 ENCOUNTER — Ambulatory Visit (INDEPENDENT_AMBULATORY_CARE_PROVIDER_SITE_OTHER): Payer: Medicaid Other | Admitting: Obstetrics & Gynecology

## 2022-09-09 VITALS — BP 103/51 | HR 60 | Ht 67.0 in | Wt 255.2 lb

## 2022-09-09 DIAGNOSIS — Z9889 Other specified postprocedural states: Secondary | ICD-10-CM

## 2022-09-09 DIAGNOSIS — D500 Iron deficiency anemia secondary to blood loss (chronic): Secondary | ICD-10-CM

## 2022-09-10 LAB — CBC
Hematocrit: 42.4 % (ref 34.0–46.6)
Hemoglobin: 12.8 g/dL (ref 11.1–15.9)
MCH: 22.8 pg — ABNORMAL LOW (ref 26.6–33.0)
MCHC: 30.2 g/dL — ABNORMAL LOW (ref 31.5–35.7)
MCV: 75 fL — ABNORMAL LOW (ref 79–97)
Platelets: 266 10*3/uL (ref 150–450)
RBC: 5.62 x10E6/uL — ABNORMAL HIGH (ref 3.77–5.28)
RDW: 13.1 % (ref 11.7–15.4)
WBC: 3.6 10*3/uL (ref 3.4–10.8)

## 2022-09-10 LAB — IRON,TIBC AND FERRITIN PANEL
Ferritin: 11 ng/mL — ABNORMAL LOW (ref 15–150)
Iron Saturation: 44 % (ref 15–55)
Iron: 189 ug/dL — ABNORMAL HIGH (ref 27–159)
Total Iron Binding Capacity: 434 ug/dL (ref 250–450)
UIBC: 245 ug/dL (ref 131–425)

## 2022-09-11 NOTE — Progress Notes (Signed)
GYNECOLOGY  VISIT  CC:   post op recheck  HPI: 36 y.o. G78P1001 Single Black or [place] American female here for recheck after undergoing TLH/bilateral salpingectomy on 08/04/2022.  Doing well.  Back at work.  Ready to be off restrictions.  Pelvic rest still recommended for 12 weeks post op.  No pain.  Reviewed pap smear recommendations.  Also, would like to recheck CBC and iron levels today.  MEDS:   Current Outpatient Medications on File Prior to Visit  Medication Sig Dispense Refill   propranolol (INDERAL) 20 MG tablet Take 1 tablet (20 mg total) by mouth 2 (two) times daily as needed. (Patient not taking: Reported on 09/09/2022) 30 tablet 1   No current facility-administered medications on file prior to visit.    SH:  Smoking No    PHYSICAL EXAMINATION:    BP (!) 103/51   Pulse 60   Ht '5\' 7"'$  (1.702 m)   Wt 255 lb 3.2 oz (115.8 kg)   LMP 07/07/2022 (Within Weeks) Comment: DOS UPREG NEGATIVE  BMI 39.97 kg/m     General appearance: alert, cooperative and appears stated age Abdomen: soft, non-tender; bowel sounds normal; no masses,  no organomegaly Incisions:  C/D/I  Pelvic: External genitalia:  no lesions              Urethra:  normal appearing urethra with no masses, tenderness or lesions              Bartholins and Skenes: normal                 Vagina: normal appearing vagina with normal color and discharge, no lesions              Cervix: absent and cuff healing well              Bimanual Exam:  Uterus:  uterus absent              Adnexa: no mass, fullness, tenderness  Assessment/Plan: 1. Iron deficiency anemia due to chronic blood loss - Iron, TIBC and Ferritin Panel - CBC  2. Post-operative state - doing well.  Letter for release to return to work without restrictions written.   - follow up 1 year

## 2022-10-16 ENCOUNTER — Encounter: Payer: Self-pay | Admitting: Internal Medicine

## 2022-10-20 ENCOUNTER — Telehealth (HOSPITAL_BASED_OUTPATIENT_CLINIC_OR_DEPARTMENT_OTHER): Payer: Self-pay | Admitting: Family

## 2022-10-20 ENCOUNTER — Encounter (HOSPITAL_BASED_OUTPATIENT_CLINIC_OR_DEPARTMENT_OTHER): Payer: Self-pay

## 2022-10-20 DIAGNOSIS — R0789 Other chest pain: Secondary | ICD-10-CM

## 2022-10-20 NOTE — Telephone Encounter (Signed)
Spoke with patient regarding 10/29/22 4:00 pm Calcium Scoring appointment at [facility]---arrival time is 3:45 pm 1st floor radiology for check in---patient states she saw the information on her My Chart and she voiced her understanding.

## 2022-10-29 ENCOUNTER — Ambulatory Visit (HOSPITAL_COMMUNITY)
Admission: RE | Admit: 2022-10-29 | Discharge: 2022-10-29 | Disposition: A | Discharge status: Home / Self Care | Payer: Medicaid Other | Source: Ambulatory Visit | Attending: Family | Admitting: Family

## 2022-10-29 DIAGNOSIS — R0789 Other chest pain: Secondary | ICD-10-CM

## 2022-12-01 ENCOUNTER — Encounter (HOSPITAL_BASED_OUTPATIENT_CLINIC_OR_DEPARTMENT_OTHER): Payer: Self-pay

## 2023-01-29 ENCOUNTER — Encounter: Payer: Self-pay | Admitting: Internal Medicine

## 2023-01-29 ENCOUNTER — Other Ambulatory Visit: Payer: Self-pay

## 2023-01-29 ENCOUNTER — Ambulatory Visit: Payer: Medicaid Other | Attending: Internal Medicine | Admitting: Internal Medicine

## 2023-01-29 VITALS — BP 112/76 | HR 64 | Temp 98.5°F | Ht 67.0 in | Wt 271.0 lb

## 2023-01-29 DIAGNOSIS — Z713 Dietary counseling and surveillance: Secondary | ICD-10-CM | POA: Insufficient documentation

## 2023-01-29 DIAGNOSIS — H538 Other visual disturbances: Secondary | ICD-10-CM | POA: Insufficient documentation

## 2023-01-29 DIAGNOSIS — G4733 Obstructive sleep apnea (adult) (pediatric): Secondary | ICD-10-CM | POA: Diagnosis not present

## 2023-01-29 DIAGNOSIS — Z6839 Body mass index (BMI) 39.0-39.9, adult: Secondary | ICD-10-CM | POA: Insufficient documentation

## 2023-01-29 DIAGNOSIS — R079 Chest pain, unspecified: Secondary | ICD-10-CM | POA: Diagnosis present

## 2023-01-29 DIAGNOSIS — D500 Iron deficiency anemia secondary to blood loss (chronic): Secondary | ICD-10-CM | POA: Diagnosis not present

## 2023-01-29 DIAGNOSIS — Z131 Encounter for screening for diabetes mellitus: Secondary | ICD-10-CM | POA: Insufficient documentation

## 2023-01-29 DIAGNOSIS — Z6841 Body Mass Index (BMI) 40.0 and over, adult: Secondary | ICD-10-CM

## 2023-01-29 DIAGNOSIS — Z7689 Persons encountering health services in other specified circumstances: Secondary | ICD-10-CM

## 2023-01-29 NOTE — Progress Notes (Signed)
Patient ID: Julia Roberts, female    DOB: 22-May-1986  MRN: [id number]  CC: Establish Care (Re- est care.Requesting blood work - currently not taking any meds/Intermittent vision concerns, blurry vision X2 mo /Intermittent chest pains X6 mo/No to Tdap vax.)   Subjective: Julia Roberts is a 37 y.o. female who presents for re-est care Her concerns today include:  Patient with history of OSA, eczema, fibroid with IDA s/p hysterectomy 07/2022, obesity  Pt request blood tests. Had hysterectomy 07/2022 due to AUB from fibroids.  Iron stopped 09/2022.  Would like blood recheck.  C/o blurred vision x 2 mths.   Intermittent.  Problems seeing things farther away.  Does not wear corrective lenses.  Some lacrimation at times.  Never had formal eye exam.  C/o intermittent CP for over 1 yr for which she had seen cardiology NP in August of last year.  At that time, her chest pain was thought to be related to profound anemia.  Patient also had cardiac CT 10/2022 that was negative for suggestive heart disease. -Chest pains occur across the upper chest and described as sharp. -Patient reports chest pains occurs mainly when she does a lot of lifting at work and with certain foods like spicy and greasy foods..  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  She does some lifting up to 30 pounds.  Chest pains can last from a few minutes to all day.  Pain does not radiate.  She does a lot of walking at work.  She also has a flight of stairs in her house.  No chest pains with prolonged walking or going up a flight of stairs. No family history of coronary artery disease.  She does not smoke.  She is also working on trying to get her weight down.  She has decreased her portion sizes and eating more vegetables instead of white starches.  She has noticed a constant craving for sweets after having her hysterectomy in October of last year.  Prior to hysterectomy she had gotten her weight down to  245 pounds.  Now she is back to 270 pounds which she attributes to eating more sweets.  She does carry her lunch from home every day.  She eats out about once a week.  Patient Active Problem List   Diagnosis Date Noted   Menorrhagia with irregular cycle 06/25/2022   Fibroids, submucosal 06/25/2022   Obstructive sleep apnea 12/26/2019   Palpitations 11/21/2019   Microcytic anemia 11/24/2018   Cardiac murmur 11/24/2018   Class 2 obesity due to excess calories with body mass index (BMI) of 39.0 to 39.9 in adult 12/02/2006   ECZEMA, ATOPIC DERMATITIS 12/02/2006     Current Outpatient Medications on File Prior to Visit  Medication Sig Dispense Refill   propranolol (INDERAL) 20 MG tablet Take 1 tablet (20 mg total) by mouth 2 (two) times daily as needed. (Patient not taking: Reported on 09/09/2022) 30 tablet 1   No current facility-administered medications on file prior to visit.    No Known Allergies  Social History   Socioeconomic History   Marital status: Single    Spouse name: Not on file   Number of children: 1   Years of education: some college   Highest education level: Not on file  Occupational History   Not on file  Tobacco Use   Smoking status: Never   Smokeless tobacco: Never  Vaping Use   Vaping Use: Never used  Substance and  Sexual Activity   Alcohol use: No   Drug use: No   Sexual activity: Yes    Birth control/protection: None  Other Topics Concern   Not on file  Social History Narrative   Not on file   Social Determinants of Health   Financial Resource Strain: Not on file  Food Insecurity: Not on file  Transportation Needs: Not on file  Physical Activity: Not on file  Stress: Not on file  Social Connections: Not on file  Intimate Partner Violence: Not on file    Family History  Problem Relation Age of Onset   Migraines Mother    Hypertension Father    Diabetes Maternal Aunt     Past Surgical History:  Procedure Laterality Date   CYSTOSCOPY  N/A 08/04/2022   Procedure: CYSTOSCOPY;  Surgeon: Jerene Bears, MD;  Location: [facility] OR;  Service: Gynecology;  Laterality: N/A;   TOTAL LAPAROSCOPIC HYSTERECTOMY WITH SALPINGECTOMY Bilateral 08/04/2022   Procedure: TOTAL LAPAROSCOPIC HYSTERECTOMY WITH BILATERAL SALPINGECTOMY;  Surgeon: Jerene Bears, MD;  Location: [facility] OR;  Service: Gynecology;  Laterality: Bilateral;    ROS: Review of Systems Negative except as stated above  PHYSICAL EXAM: BP 112/76 (BP Location: Left Arm, Patient Position: Sitting, Cuff Size: Normal)   Pulse 64   Temp 98.5 F (36.9 C) (Oral)   Ht 5\' 7"  (1.702 m)   Wt 271 lb (122.9 kg)   LMP 07/07/2022 (Within Weeks) Comment: DOS UPREG NEGATIVE  SpO2 100%   BMI 42.44 kg/m   Physical Exam  General appearance - alert, well appearing, middle-age obese African-American female and in no distress Mental status - normal mood, behavior, speech, dress, motor activity, and thought processes Eyes - pupils equal and reactive, extraocular eye movements intact Neck - supple, no significant adenopathy Chest - clear to auscultation, no wheezes, rales or rhonchi, symmetric air entry Heart - normal rate, regular rhythm, normal S1, S2, no murmurs, rubs, clicks or gallops Extremities - peripheral pulses normal, no pedal edema, no clubbing or cyanosis      Latest Ref Rng & Units 07/31/2022    2:47 PM 05/06/2022    8:00 AM 05/05/2022    3:36 PM  CMP  Glucose 70 - 99 mg/dL 161  98  89   BUN 6 - 20 mg/dL 8  7  8    Creatinine 0.44 - 1.00 mg/dL 0.96  0.45  4.09   Sodium 135 - 145 mmol/L 138  136  136   Potassium 3.5 - 5.1 mmol/L 3.8  4.1  4.4   Chloride 98 - 111 mmol/L 109  108  103   CO2 22 - 32 mmol/L 26  23  21    Calcium 8.9 - 10.3 mg/dL 81.1  9.5  9.8   Total Protein 6.5 - 8.1 g/dL  6.6  6.9   Total Bilirubin 0.3 - 1.2 mg/dL  0.5  0.4   Alkaline Phos 38 - 126 U/L  50  63   AST 15 - 41 U/L  21  15   ALT 0 - 44 U/L  15  13    Lipid Panel     Component Value Date/Time    CHOL 119 11/30/2019 0814   TRIG 43 11/30/2019 0814   HDL 51 11/30/2019 0814   CHOLHDL 2.3 11/30/2019 0814   LDLCALC 57 11/30/2019 0814    CBC    Component Value Date/Time   WBC 3.6 09/09/2022 1041   WBC 3.9 (L) 07/31/2022 1447   RBC 5.62 (  H) 09/09/2022 1041   RBC 4.55 07/31/2022 1447   HGB 12.8 09/09/2022 1041   HCT 42.4 09/09/2022 1041   PLT 266 09/09/2022 1041   MCV 75 (L) 09/09/2022 1041   MCH 22.8 (L) 09/09/2022 1041   MCH 23.7 (L) 07/31/2022 1447   MCHC 30.2 (L) 09/09/2022 1041   MCHC 29.7 (L) 07/31/2022 1447   RDW 13.1 09/09/2022 1041   LYMPHSABS 1.4 07/28/2022 1606   MONOABS 0.2 05/06/2022 0930   EOSABS 0.1 07/28/2022 1606   BASOSABS 0.0 07/28/2022 1606  EKG shows sinus bradycardia, 1st degree AVB with some T inversions in the precordial leads unchanged from previous EKG done in August of last year.  ASSESSMENT AND PLAN: 1. Establishing care with new doctor, encounter for   2. Chest pain in adult Chest pain sounds musculoskeletal in nature plus or minus GERD.  She reports chest pain with heavy lifting at work.  However no chest pain with walking or going up and down flights of stairs in her home.  She has had workup by cardiology including coronary CT which was negative. GERD precautions discussed including foods to avoid. Recommend taking some Tylenol as needed when she gets chest pains post lifting.  Follow-up if no improvement or things get worse. - Comprehensive metabolic panel - EKG 12-Lead  3. Blurred vision - Ambulatory referral to Ophthalmology  4. Morbid obesity (HCC) Healthy eating habits discussed. Patient advised to eliminate sugary drinks from the diet, cut back on portion sizes especially of white carbohydrates, eat more white lean meat like chicken [place] and seafood instead of beef or pork and incorporate fresh fruits and vegetables into the diet daily. -Advised to get in some form of moderate intensity exercise at least 3 to 5 days a week for 30  minutes. - Comprehensive metabolic panel - Lipid panel - Hemoglobin A1c - Amb ref to Medical Nutrition Therapy-MNT  5. Iron deficiency anemia due to chronic blood loss This should have resolved given that she no longer has menstrual cycles. - CBC  6. Diabetes mellitus screening - Hemoglobin A1c     Patient was given the opportunity to ask questions.  Patient verbalized understanding of the plan and was able to repeat key elements of the plan.   This documentation was completed using Paediatric nurse.  Any transcriptional errors are unintentional.  Orders Placed This Encounter  Procedures   CBC   Comprehensive metabolic panel   Lipid panel   Hemoglobin A1c   Ambulatory referral to Ophthalmology   Amb ref to Medical Nutrition Therapy-MNT   EKG 12-Lead     Requested Prescriptions    No prescriptions requested or ordered in this encounter    Return if symptoms worsen or fail to improve.  Jonah Blue, MD, FACP

## 2023-01-29 NOTE — Patient Instructions (Signed)

## 2023-01-30 ENCOUNTER — Encounter: Payer: Self-pay | Admitting: Internal Medicine

## 2023-01-30 LAB — COMPREHENSIVE METABOLIC PANEL
ALT: 16 IU/L (ref 0–32)
AST: 19 IU/L (ref 0–40)
Albumin/Globulin Ratio: 1.6 (ref 1.2–2.2)
Albumin: 4.3 g/dL (ref 3.9–4.9)
Alkaline Phosphatase: 97 IU/L (ref 44–121)
BUN/Creatinine Ratio: 12 (ref 9–23)
BUN: 8 mg/dL (ref 6–20)
Bilirubin Total: 0.4 mg/dL (ref 0.0–1.2)
CO2: 22 mmol/L (ref 20–29)
Calcium: 10.2 mg/dL (ref 8.7–10.2)
Chloride: 103 mmol/L (ref 96–106)
Creatinine, Ser: 0.65 mg/dL (ref 0.57–1.00)
Globulin, Total: 2.7 g/dL (ref 1.5–4.5)
Glucose: 82 mg/dL (ref 70–99)
Potassium: 4.2 mmol/L (ref 3.5–5.2)
Sodium: 137 mmol/L (ref 134–144)
Total Protein: 7 g/dL (ref 6.0–8.5)
eGFR: 117 mL/min/{1.73_m2} (ref >59)

## 2023-01-30 LAB — CBC
Hematocrit: 42 % (ref 34.0–46.6)
Hemoglobin: 13.5 g/dL (ref 11.1–15.9)
MCH: 25.2 pg — ABNORMAL LOW (ref 26.6–33.0)
MCHC: 32.1 g/dL (ref 31.5–35.7)
MCV: 79 fL (ref 79–97)
Platelets: 215 10*3/uL (ref 150–450)
RBC: 5.35 x10E6/uL — ABNORMAL HIGH (ref 3.77–5.28)
RDW: 12.7 % (ref 11.7–15.4)
WBC: 3.3 10*3/uL — ABNORMAL LOW (ref 3.4–10.8)

## 2023-01-30 LAB — LIPID PANEL
Chol/HDL Ratio: 2.7 ratio (ref 0.0–4.4)
Cholesterol, Total: 154 mg/dL (ref 100–199)
HDL: 57 mg/dL (ref >39)
LDL Chol Calc (NIH): 82 mg/dL (ref 0–99)
Triglycerides: 75 mg/dL (ref 0–149)
VLDL Cholesterol Cal: 15 mg/dL (ref 5–40)

## 2023-01-30 LAB — HEMOGLOBIN A1C
Est. average glucose Bld gHb Est-mCnc: 128 mg/dL
Hgb A1c MFr Bld: 6.1 % — ABNORMAL HIGH (ref 4.8–5.6)

## 2023-03-05 ENCOUNTER — Encounter: Payer: Medicaid Other | Attending: Internal Medicine | Admitting: Dietician

## 2023-03-05 ENCOUNTER — Encounter: Payer: Self-pay | Admitting: Dietician

## 2023-03-05 VITALS — Ht 67.0 in | Wt 261.6 lb

## 2023-03-05 DIAGNOSIS — Z6841 Body Mass Index (BMI) 40.0 and over, adult: Secondary | ICD-10-CM | POA: Insufficient documentation

## 2023-03-05 DIAGNOSIS — Z713 Dietary counseling and surveillance: Secondary | ICD-10-CM | POA: Insufficient documentation

## 2023-03-05 DIAGNOSIS — G4733 Obstructive sleep apnea (adult) (pediatric): Secondary | ICD-10-CM | POA: Insufficient documentation

## 2023-03-05 DIAGNOSIS — E669 Obesity, unspecified: Secondary | ICD-10-CM

## 2023-03-05 NOTE — Progress Notes (Signed)
Medical Nutrition Therapy  Appointment Start time:  [phone]  Appointment End time:  1030  Primary concerns today: Weight loss  Referral diagnosis: E66.01 Morbid Obesity Preferred learning style: No preference indicated Learning readiness: Change in progress   NUTRITION ASSESSMENT   Anthropometrics  Wt: 261.6 lbs Ht:5'7"  Goal weight: 200 - 220 lbs.  Clinical Medical Hx: Hysterectomy, OSA, Obesity Medications: N/A Labs: A1c - 6.1% Notable Signs/Symptoms: N/A  Lifestyle & Dietary Hx Pt reports desire to achieve goal body weight of 200 - 220 lbs and maintain it. Pt states they have already lost 10 lbs in the last month. Pt states they have been able to lose weight by cutting back on sweets, no dark soda, small portions/smaller plate, choosing more vegetables at meal times, using lower calorie alternative, consistent physical activity. Pt reports lunch is the meal they miss most often, maybe twice a week. Otherwise, eats at fairly regular intervals. Pt reports stress increase after hysterectomy in October, states that they like to drive and listen to R&B to lower stress. Pt reports back and knee pain after work and activity, hoping this lessens with weight loss.    Estimated daily fluid intake: 64 - 92 oz Supplements: Fiber gummies occasionally Sleep: Sleeps well Stress / self-care: [place] Current average weekly physical activity: 3 days a week ~60 minutes, jumps on trampoline, walks, goes to neighborhood gym  24-Hr Dietary Recall First Meal: Date and raisin Oatmeal, water Snack: none Second Meal: Spinach, onion, chicken, tomato pizza slice, Sprite Snack: none Third Meal: Leftover beef tacos w/ beef lettuce and cheese, flour tortilla, Sprite Snack: none Beverages: Water, Sprite    NUTRITION DIAGNOSIS  NB-1.1 Food and nutrition-related knowledge deficit As related to obesity.  As evidenced by BMI of 40.97 kg/m2.   NUTRITION INTERVENTION  Nutrition education (E-1) on the  following topics:  Educated patient on the balanced plate eating model. Recommended lunch and dinner be 1/2 non-starchy vegetables, 1/4 starches, and 1/4 protein. Recommended breakfast be a balance of starch and protein with a piece of fruit. Discussed with patient the importance of working towards hitting the proportions of the balanced plate consistently. Counseled patient on ways to begin recognizing each of the food groups from the balanced plate in their own meals, and how close they are to fitting the recommended proportions of the balanced plate. Educated patient on the nutritional value of each food group on the balanced plate model. Counseled patient on beginning to rebuild their trust in themselves to make the right food choices for their health. Educated patient on mindful eating, including listening to their body's hunger and satiety cues, as well as eating slowly and allowing meals to be more of a sensory experience.   Handouts Provided Include  Carbohydrate foods list Protein foods list Non-Starchy vegetables food list  Learning Style & Readiness for Change Teaching method utilized: Visual & Auditory  Demonstrated degree of understanding via: Teach Back  Barriers to learning/adherence to lifestyle change: None  Goals Established by Pt Keep up the great work with your current weight loss of 1-2 lbs per week. Keep towards eating three meals a day, about 5-6 hours apart! Continue building your meals with these main groups: Carbohydrates, proteins, and non-starchy vegetables! Begin to build your meals using the proportions of the Balanced Plate. First, select your carb choice(s) for the meal. Make this 25% of your meal. Next, select your source of protein to pair with your carb choice(s). Make this another 25% of your meal. Finally,  complete your meal with a variety of non-starchy vegetables. Make this the remaining 50% of your meal. Choose "Carb-Smart" or "Carb-Balance" tortillas for  your tacos, enchiladas, pizzas, wraps When drinking fruit juices, work towards only having a 4 oz portion each time!   MONITORING & EVALUATION Dietary intake, weekly physical activity, and weight loss in 6 weeks.  Next Steps  Patient is to call to schedule follow up.

## 2023-03-05 NOTE — Patient Instructions (Addendum)
Keep up the great work with your current weight loss of 1-2 lbs per week.  Keep towards eating three meals a day, about 5-6 hours apart!  Continue building your meals with these main groups: Carbohydrates, proteins, and non-starchy vegetables!  Begin to build your meals using the proportions of the Balanced Plate. First, select your carb choice(s) for the meal. Make this 25% of your meal. Next, select your source of protein to pair with your carb choice(s). Make this another 25% of your meal. Finally, complete your meal with a variety of non-starchy vegetables. Make this the remaining 50% of your meal.  Choose "Carb-Smart" or "Carb-Balance" tortillas for your tacos, enchiladas, pizzas, wraps  When drinking fruit juices, work towards only having a 4 oz portion each time!

## 2023-05-31 ENCOUNTER — Ambulatory Visit: Payer: Medicaid Other | Admitting: Dietician

## 2023-09-10 ENCOUNTER — Ambulatory Visit (HOSPITAL_BASED_OUTPATIENT_CLINIC_OR_DEPARTMENT_OTHER): Payer: Medicaid Other | Admitting: Obstetrics & Gynecology

## 2023-09-20 ENCOUNTER — Encounter (HOSPITAL_BASED_OUTPATIENT_CLINIC_OR_DEPARTMENT_OTHER): Payer: Self-pay

## 2023-09-20 ENCOUNTER — Ambulatory Visit (INDEPENDENT_AMBULATORY_CARE_PROVIDER_SITE_OTHER): Payer: Self-pay | Admitting: Certified Nurse Midwife

## 2023-09-21 NOTE — Progress Notes (Signed)
Canceled. Julia Roberts

## 2023-09-27 ENCOUNTER — Ambulatory Visit (HOSPITAL_BASED_OUTPATIENT_CLINIC_OR_DEPARTMENT_OTHER): Payer: Medicaid Other | Admitting: Obstetrics & Gynecology

## 2023-10-03 ENCOUNTER — Encounter (HOSPITAL_BASED_OUTPATIENT_CLINIC_OR_DEPARTMENT_OTHER): Payer: Self-pay | Admitting: Obstetrics & Gynecology

## 2023-10-04 ENCOUNTER — Other Ambulatory Visit (HOSPITAL_BASED_OUTPATIENT_CLINIC_OR_DEPARTMENT_OTHER): Payer: Self-pay | Admitting: Obstetrics & Gynecology

## 2023-10-04 DIAGNOSIS — Z6841 Body Mass Index (BMI) 40.0 and over, adult: Secondary | ICD-10-CM

## 2023-10-15 ENCOUNTER — Other Ambulatory Visit (HOSPITAL_BASED_OUTPATIENT_CLINIC_OR_DEPARTMENT_OTHER): Payer: Self-pay | Admitting: Obstetrics & Gynecology

## 2023-10-15 MED ORDER — SULFAMETHOXAZOLE-TRIMETHOPRIM 800-160 MG PO TABS: 1.0000 | ORAL_TABLET | Freq: Two times a day (BID) | ORAL | 0 refills | Status: DC

## 2023-10-15 NOTE — Progress Notes (Signed)
 Called pt.  Having sensation of soreness when bladder is full.  There is some pain with emptying bladder as well.  Never had a UTI.  This is new.  H/O hysterectomy in 2023.  Denies hematuria, vaginal bleeding or vaginal discharge.  No fevers.  Rx for bactrim  DS bid x 3 days to pharmacy and pt given appt for next week.  Precautions for going to ER given to pt.

## 2023-10-15 NOTE — Telephone Encounter (Signed)
 Called pt in response to myChart message. Pt reports that she has a feeling of soreness that started this morning after urinating. She said it hurts most when her bladder gets full. She denies any other symptoms. Advised that I would make provider aware of symptoms and get back to her with recommendations.

## 2023-10-21 ENCOUNTER — Encounter (HOSPITAL_BASED_OUTPATIENT_CLINIC_OR_DEPARTMENT_OTHER): Payer: Self-pay | Admitting: Obstetrics & Gynecology

## 2023-10-21 ENCOUNTER — Ambulatory Visit (HOSPITAL_BASED_OUTPATIENT_CLINIC_OR_DEPARTMENT_OTHER): Payer: Medicaid Other | Admitting: Obstetrics & Gynecology

## 2023-10-21 VITALS — BP 129/73 | HR 62 | Ht 66.5 in | Wt 261.8 lb

## 2023-10-21 DIAGNOSIS — Z862 Personal history of diseases of the blood and blood-forming organs and certain disorders involving the immune mechanism: Secondary | ICD-10-CM | POA: Diagnosis not present

## 2023-10-21 DIAGNOSIS — R6882 Decreased libido: Secondary | ICD-10-CM | POA: Diagnosis not present

## 2023-10-21 DIAGNOSIS — Z90710 Acquired absence of both cervix and uterus: Secondary | ICD-10-CM

## 2023-10-21 NOTE — Progress Notes (Signed)
GYNECOLOGY  VISIT  CC:   f/u after hysterectomy  HPI: 38 y.o. G40P1001 Single Black or African American female here for follow up after undergoing hysterectomy 08/04/2022.  H/O menorrhagia with iron deficiency anemia.  Doing well.  Pathology reviewed with pt.  Cervical pathology was benign and h/o normal pap smears prior to surgery.  Reminded pt, pap smears for screening not indicated.  Denies pelvic pain.  No vaginal bleeding or discharge.  Still working full time and has been with employer about a year.  Did get complete her certification.  Partner with pt today as well.  Hb 13.5  one 01/29/2023 with Dr. Laural Benes.  One complaint over the last year is decreased libido.  Would like to consider options and evaluation.  Will draw blood work today.      Past Medical History:  Diagnosis Date   Anemia    Chlamydia    ECZEMA, ATOPIC DERMATITIS 12/02/2006   Qualifier: Diagnosis of  By: Bebe Shaggy     Gonorrhea    Migraines    OBESITY, NOS 12/02/2006   Qualifier: Diagnosis of  By: Bebe Shaggy     Palpitations    Sleep apnea     MEDS:   Current Outpatient Medications on File Prior to Visit  Medication Sig Dispense Refill   propranolol (INDERAL) 20 MG tablet Take 1 tablet (20 mg total) by mouth 2 (two) times daily as needed. (Patient not taking: Reported on 09/09/2022) 30 tablet 1   No current facility-administered medications on file prior to visit.    ALLERGIES: Patient has no known allergies.  SH:  partnered, non smoker  Review of Systems  Constitutional: Negative.   Genitourinary: Negative.     PHYSICAL EXAMINATION:    BP 129/73 (BP Location: Right Arm, Patient Position: Sitting, Cuff Size: Large)   Pulse 62   Ht 5' 6.5" (1.689 m)   Wt 261 lb 12.8 oz (118.8 kg)   LMP 07/07/2022 (Within Weeks) Comment: DOS UPREG NEGATIVE  BMI 41.62 kg/m     General appearance: alert, cooperative and appears stated age Abdomen: soft, non-tender; bowel sounds normal; no masses,  no  organomegaly Lymph:  no inguinal LAD noted  Pelvic: External genitalia:  no lesions              Urethra:  normal appearing urethra with no masses, tenderness or lesions              Bartholins and Skenes: normal                 Vagina: normal mucosa without prolapse or lesions              Cervix: absent              Bimanual Exam:  Uterus:  normal size, contour, position, consistency, mobility, non-tender              Adnexa: no mass, fullness, tenderness  Chaperone present for exam  Assessment/Plan: 1. H/O: hysterectomy (Primary) - doing well one year post op.  Discussed typical follow up and no need for future pap smears  2. History of anemia - has resolved since surgery and cessation of bleeding  3. Decreased libido - Testosterone, Total, LC/MS/MS   Total time with pt including documentation:  28 minutes

## 2023-10-26 LAB — TESTOSTERONE, TOTAL, LC/MS/MS: Testosterone, total: 47.2 ng/dL (ref 10.0–55.0)

## 2023-10-27 ENCOUNTER — Encounter (HOSPITAL_BASED_OUTPATIENT_CLINIC_OR_DEPARTMENT_OTHER): Payer: Self-pay | Admitting: Obstetrics & Gynecology

## 2023-10-27 DIAGNOSIS — Z658 Other specified problems related to psychosocial circumstances: Secondary | ICD-10-CM

## 2024-03-14 ENCOUNTER — Ambulatory Visit: Admitting: Podiatry

## 2024-05-29 ENCOUNTER — Ambulatory Visit: Admitting: Podiatry

## 2024-05-29 ENCOUNTER — Encounter: Payer: Self-pay | Admitting: Podiatry

## 2024-05-29 DIAGNOSIS — B351 Tinea unguium: Secondary | ICD-10-CM | POA: Diagnosis not present

## 2024-05-29 MED ORDER — TERBINAFINE HCL 250 MG PO TABS: 250.0000 mg | ORAL_TABLET | Freq: Every day | ORAL | 0 refills | Status: DC

## 2024-05-29 NOTE — Progress Notes (Signed)
   Chief Complaint  Patient presents with   Nail Problem    Hallux right - injured as a child, toenail thick and discolored, usually puts acrylic on it to cover appearance, any way to help get the toenail looking better?   New Patient (Initial Visit)    Est pt 2018    Subjective: 38 y.o. female presenting today as a new patient for above complaint  Past Medical History:  Diagnosis Date   Anemia    Chlamydia    ECZEMA, ATOPIC DERMATITIS 12/02/2006   Qualifier: Diagnosis of  By: JANEY PARODY     Gonorrhea    Migraines    OBESITY, NOS 12/02/2006   Qualifier: Diagnosis of  By: JANEY PARODY     Palpitations    Sleep apnea     Past Surgical History:  Procedure Laterality Date   CYSTOSCOPY N/A 08/04/2022   Procedure: CYSTOSCOPY;  Surgeon: Cleotilde Ronal RAMAN, MD;  Location: [facility] OR;  Service: Gynecology;  Laterality: N/A;   TOTAL LAPAROSCOPIC HYSTERECTOMY WITH SALPINGECTOMY Bilateral 08/04/2022   Procedure: TOTAL LAPAROSCOPIC HYSTERECTOMY WITH BILATERAL SALPINGECTOMY;  Surgeon: Cleotilde Ronal RAMAN, MD;  Location: [facility] OR;  Service: Gynecology;  Laterality: Bilateral;    No Known Allergies  Objective: Physical Exam General: The patient is alert and oriented x3 in no acute distress.  Dermatology: Hyperkeratotic, discolored, thickened, onychodystrophy noted. Skin is warm, dry and supple bilateral lower extremities. Negative for open lesions or macerations.  Vascular: Palpable pedal pulses bilaterally. No edema or erythema noted. Capillary refill within normal limits.  Neurological: Grossly intact via light touch  Musculoskeletal Exam: No pedal deformity noted  Assessment: #1 Onychomycosis of toenails left great toe  Plan of Care:  -Patient was evaluated. -Today we discussed different treatment options including oral, topical, and laser antifungal treatment modalities.  We discussed their efficacies and side effects.  Patient opts for oral antifungal treatment  modality -Prescription for Lamisil  250 mg #90 daily. Pt denies a history of liver pathology or symptoms.  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 Thresa EMERSON Janit, DPM    [address] [place], [place] [address]                Office [phone]  Fax [phone]

## 2024-05-30 ENCOUNTER — Other Ambulatory Visit (HOSPITAL_COMMUNITY)
Admission: RE | Admit: 2024-05-30 | Discharge: 2024-05-30 | Disposition: A | Discharge status: Home / Self Care | Source: Ambulatory Visit | Attending: Family Medicine | Admitting: Family Medicine

## 2024-05-30 ENCOUNTER — Ambulatory Visit: Attending: Family Medicine | Admitting: Internal Medicine

## 2024-05-30 VITALS — BP 119/79 | HR 68 | Temp 98.3°F | Ht 66.0 in | Wt 270.0 lb

## 2024-05-30 DIAGNOSIS — Z1159 Encounter for screening for other viral diseases: Secondary | ICD-10-CM | POA: Diagnosis not present

## 2024-05-30 DIAGNOSIS — R7303 Prediabetes: Secondary | ICD-10-CM | POA: Diagnosis not present

## 2024-05-30 DIAGNOSIS — L309 Dermatitis, unspecified: Secondary | ICD-10-CM

## 2024-05-30 DIAGNOSIS — Z113 Encounter for screening for infections with a predominantly sexual mode of transmission: Secondary | ICD-10-CM

## 2024-05-30 DIAGNOSIS — Z6841 Body Mass Index (BMI) 40.0 and over, adult: Secondary | ICD-10-CM

## 2024-05-30 DIAGNOSIS — Z0000 Encounter for general adult medical examination without abnormal findings: Secondary | ICD-10-CM | POA: Diagnosis not present

## 2024-05-30 DIAGNOSIS — Z114 Encounter for screening for human immunodeficiency virus [HIV]: Secondary | ICD-10-CM

## 2024-05-30 LAB — POCT GLYCOSYLATED HEMOGLOBIN (HGB A1C): HbA1c, POC (prediabetic range): 5.7 % (ref 5.7–6.4)

## 2024-05-30 LAB — GLUCOSE, POCT (MANUAL RESULT ENTRY): POC Glucose: 88 mg/dL (ref 70–99)

## 2024-05-30 MED ORDER — TRIAMCINOLONE ACETONIDE 0.1 % EX CREA: 1.0000 | TOPICAL_CREAM | Freq: Two times a day (BID) | CUTANEOUS | 0 refills | Status: AC

## 2024-05-30 MED ORDER — PHENTERMINE HCL 30 MG PO CAPS: 30.0000 mg | ORAL_CAPSULE | ORAL | 1 refills | Status: DC

## 2024-05-30 NOTE — Progress Notes (Signed)
 Patient ID: Julia Roberts, female    DOB: Feb 15, 1986  MRN: [id number]  CC: Annual Exam (Physical. Layvonne STD testing, kidney function, CBC/Requesting dermatology referral/Requesting to discuss GLP 1 )   Subjective: Ranelle Basulto is a 38 y.o. female who presents for chronic ds management. Her concerns today include:  Patient with history of OSA, eczema, fibroid with IDA s/p hysterectomy 07/2022, obesity/preDM    Discussed the use of AI scribe software for clinical note transcription with the patient, who gave verbal consent to proceed.  History of Present Illness Chania L Ardythe Klute is a 38 year old female who presents for an annual physical exam and to discuss weight management.  She maintains her weight at 270 pounds, similar to her weight in the spring of last year. Despite dietary changes, such as eliminating bread, pasta, and sodas, and increasing water intake, she has not observed significant weight changes. She engages in physical activity by walking in her neighborhood three times a week for about 40 minutes.  She is interested in weight management options, including the use of GLP-1 agents like Wegovy or Zepbound, and inquires about the cost of these medications if not covered by insurance.  She has a history of prediabetes, with previous blood tests indicating she was in the prediabetes range. She is currently undergoing screening to ensure she has not progressed to full diabetes.  She requests screening for sexually transmitted infections, including HIV, chlamydia, gonorrhea, trichomonas, and syphilis. She reports having had two partners in the past eight months and consistently using condoms.  She experiences eczema flare-ups primarily on her legs and uses over-the-counter Aveeno and hydrocortisone cream for management. She has not seen a dermatologist before and requests a referral.  She does not drink alcohol, smoke cigarettes, or use street  drugs.    Patient Active Problem List   Diagnosis Date Noted   Obstructive sleep apnea 12/26/2019   Palpitations 11/21/2019   History of anemia 11/24/2018   Cardiac murmur 11/24/2018   Class 2 obesity due to excess calories with body mass index (BMI) of 39.0 to 39.9 in adult 12/02/2006   ECZEMA, ATOPIC DERMATITIS 12/02/2006     Current Outpatient Medications on File Prior to Visit  Medication Sig Dispense Refill   Biotin w/ Vitamins C & E (HAIR SKIN & NAILS GUMMIES PO) Take by mouth.     terbinafine  (LAMISIL ) 250 MG tablet Take 1 tablet (250 mg total) by mouth daily. 90 tablet 0   No current facility-administered medications on file prior to visit.    No Known Allergies  Social History   Socioeconomic History   Marital status: Single    Spouse name: Not on file   Number of children: 1   Years of education: some college   Highest education level: Associate degree: occupational, Scientist, product/process development, or vocational program  Occupational History   Not on file  Tobacco Use   Smoking status: Never   Smokeless tobacco: Never  Vaping Use   Vaping status: Never Used  Substance and Sexual Activity   Alcohol use: No   Drug use: No   Sexual activity: Yes    Birth control/protection: None  Other Topics Concern   Not on file  Social History Narrative   Not on file   Social Drivers of Health   Financial Resource Strain: Low Risk  (05/30/2024)   Overall Financial Resource Strain (CARDIA)    Difficulty of Paying Living Expenses: Not hard at all  Food Insecurity: No Food  Insecurity (05/30/2024)   Hunger Vital Sign    Worried About Running Out of Food in the Last Year: Never true    Ran Out of Food in the Last Year: Never true  Transportation Needs: No Transportation Needs (05/30/2024)   PRAPARE - Administrator, Civil Service (Medical): No    Lack of Transportation (Non-Medical): No  Physical Activity: Insufficiently Active (05/30/2024)   Exercise Vital Sign    Days of  Exercise per Week: 3 days    Minutes of Exercise per Session: 40 min  Stress: No Stress Concern Present (05/30/2024)   [employer] of Occupational Health - Occupational Stress Questionnaire    Feeling of Stress: Only a little  Social Connections: Socially Isolated (05/30/2024)   Social Connection and Isolation Panel    Frequency of Communication with Friends and Family: Once a week    Frequency of Social Gatherings with Friends and Family: Once a week    Attends Religious Services: Never    Database administrator or Organizations: No    Attends Engineer, structural: Not on file    Marital Status: Never married  Intimate Partner Violence: Unknown (02/28/2022)   Received from [facility]   HITS    Physically Hurt: Not on file    Insult or Talk Down To: Not on file    Threaten Physical Harm: Not on file    Scream or Curse: Not on file    Family History  Problem Relation Age of Onset   Migraines Mother    Hypertension Father    Diabetes Maternal Aunt     Past Surgical History:  Procedure Laterality Date   CYSTOSCOPY N/A 08/04/2022   Procedure: CYSTOSCOPY;  Surgeon: Cleotilde Ronal RAMAN, MD;  Location: [facility] OR;  Service: Gynecology;  Laterality: N/A;   TOTAL LAPAROSCOPIC HYSTERECTOMY WITH SALPINGECTOMY Bilateral 08/04/2022   Procedure: TOTAL LAPAROSCOPIC HYSTERECTOMY WITH BILATERAL SALPINGECTOMY;  Surgeon: Cleotilde Ronal RAMAN, MD;  Location: [facility] OR;  Service: Gynecology;  Laterality: Bilateral;    ROS: Review of Systems  HENT:  Negative for hearing loss and trouble swallowing.   Eyes:  Negative for visual disturbance.       Wears rxn glasses.  Last eye exam was about 2 yrs ago  Respiratory:  Negative for cough and shortness of breath.   Cardiovascular:  Negative for chest pain.  Gastrointestinal:  Negative for abdominal pain and blood in stool.  Genitourinary:  Negative for difficulty urinating.  Musculoskeletal:  Negative for arthralgias.   Negative except as stated  above  PHYSICAL EXAM: BP 119/79 (BP Location: Left Arm, Patient Position: Sitting, Cuff Size: Normal)   Pulse 68   Temp 98.3 F (36.8 C) (Oral)   Ht 5' 6 (1.676 m)   Wt 270 lb (122.5 kg)   LMP 07/07/2022 (Within Weeks) Comment: DOS UPREG NEGATIVE  SpO2 100%   BMI 43.58 kg/m   Wt Readings from Last 3 Encounters:  05/30/24 270 lb (122.5 kg)  10/21/23 261 lb 12.8 oz (118.8 kg)  09/20/23 266 lb 9.6 oz (120.9 kg)  ' Physical Exam  General appearance - alert, well appearing, middle-age obese African-American female and in no distress Mental status - normal mood, behavior, speech, dress, motor activity, and thought processes Eyes - pupils equal and reactive, extraocular eye movements intact Ears -small amount of  wax at the opening of both ear canal.  Tympanic membranes within normal. Nose -moderate enlargement of nasal turbinates. Mouth - mucous membranes moist, pharynx normal  without lesions Neck - supple, no significant adenopathy Lymphatics - no palpable lymphadenopathy, no hepatosplenomegaly Chest - clear to auscultation, no wheezes, rales or rhonchi, symmetric air entry Heart - normal rate, regular rhythm, normal S1, S2, no murmurs, rubs, clicks or gallops Abdomen - soft, nontender, nondistended, no masses or organomegaly Neurological - cranial nerves II through XII intact, motor and sensory grossly normal bilaterally Musculoskeletal - no joint tenderness, deformity or swelling Extremities - peripheral pulses normal, no pedal edema, no clubbing or cyanosis Skin -patches of dried skin noted over both shin left greater than right.  Please see photo below.      Latest Ref Rng & Units 01/29/2023   12:11 PM 07/31/2022    2:47 PM 05/06/2022    8:00 AM  CMP  Glucose 70 - 99 mg/dL 82  892  98   BUN 6 - 20 mg/dL 8  8  7    Creatinine 0.57 - 1.00 mg/dL 9.34  9.17  9.28   Sodium 134 - 144 mmol/L 137  138  136   Potassium 3.5 - 5.2 mmol/L 4.2  3.8  4.1   Chloride 96 - 106 mmol/L 103   109  108   CO2 20 - 29 mmol/L 22  26  23    Calcium 8.7 - 10.2 mg/dL 89.7  89.7  9.5   Total Protein 6.0 - 8.5 g/dL 7.0   6.6   Total Bilirubin 0.0 - 1.2 mg/dL 0.4   0.5   Alkaline Phos 44 - 121 IU/L 97   50   AST 0 - 40 IU/L 19   21   ALT 0 - 32 IU/L 16   15    Lipid Panel     Component Value Date/Time   CHOL 154 01/29/2023 1211   TRIG 75 01/29/2023 1211   HDL 57 01/29/2023 1211   CHOLHDL 2.7 01/29/2023 1211   LDLCALC 82 01/29/2023 1211    CBC    Component Value Date/Time   WBC 3.3 (L) 01/29/2023 1211   WBC 3.9 (L) 07/31/2022 1447   RBC 5.35 (H) 01/29/2023 1211   RBC 4.55 07/31/2022 1447   HGB 13.5 01/29/2023 1211   HCT 42.0 01/29/2023 1211   PLT 215 01/29/2023 1211   MCV 79 01/29/2023 1211   MCH 25.2 (L) 01/29/2023 1211   MCH 23.7 (L) 07/31/2022 1447   MCHC 32.1 01/29/2023 1211   MCHC 29.7 (L) 07/31/2022 1447   RDW 12.7 01/29/2023 1211   LYMPHSABS 1.4 07/28/2022 1606   MONOABS 0.2 05/06/2022 0930   EOSABS 0.1 07/28/2022 1606   BASOSABS 0.0 07/28/2022 1606   Blood sugar 88/A1C 5.7  ASSESSMENT AND PLAN: 1. Annual physical exam (Primary) Discussed the importance of brushing and flossing teeth regularly and seeing a dentist at least twice a year for routine cleaning. Discussed the importance of getting routine eye exam at least once every 2 years.  2. Morbid obesity (HCC) Commended her on her exercise routine.  Encouraged her to continue. Patient advised to eliminate sugary drinks from the diet, cut back on portion sizes especially of white carbohydrates, eat more white lean meat like chicken [place] and seafood instead of beef or pork and incorporate fresh fruits and vegetables into the diet daily. -GLP-1's not covered by her insurance.  We discussed trying her with phentermine  instead.  Advised that the medicine can cause slight bump in blood pressure.  Can also cause palpitations.  No if she has any palpitations. - phentermine  30 MG capsule; Take  1 capsule (30 mg  total) by mouth every morning.  Dispense: 30 capsule; Refill: 1 - CBC - Comprehensive metabolic panel with GFR - Lipid panel  3. Prediabetes See #2 above - POCT glycosylated hemoglobin (Hb A1C) - POCT glucose (manual entry)  4. Need for hepatitis C screening test - Hepatitis C Antibody  5. Eczema, unspecified type - Ambulatory referral to Dermatology - triamcinolone  cream (KENALOG ) 0.1 %; Apply 1 Application topically 2 (two) times daily.  Dispense: 30 g; Refill: 0  6. Screening for HIV (human immunodeficiency virus) - HIV antibody (with reflex)  7. Routine screening for STI (sexually transmitted infection) - Cervicovaginal ancillary only - RPR   Patient was given the opportunity to ask questions.  Patient verbalized understanding of the plan and was able to repeat key elements of the plan.   This documentation was completed using Paediatric nurse.  Any transcriptional errors are unintentional.  Orders Placed This Encounter  Procedures   HIV antibody (with reflex)   Hepatitis C Antibody   RPR   CBC   Comprehensive metabolic panel with GFR   Lipid panel   Ambulatory referral to Dermatology   POCT glycosylated hemoglobin (Hb A1C)   POCT glucose (manual entry)     Requested Prescriptions   Signed Prescriptions Disp Refills   phentermine  30 MG capsule 30 capsule 1    Sig: Take 1 capsule (30 mg total) by mouth every morning.   triamcinolone  cream (KENALOG ) 0.1 % 30 g 0    Sig: Apply 1 Application topically 2 (two) times daily.    Return in about 1 month (around 06/30/2024) for weight management.  Barnie Louder, MD, FACP

## 2024-05-30 NOTE — Patient Instructions (Addendum)
 VISIT SUMMARY:  Today, you had your annual physical exam and discussed weight management options. You have maintained your weight at 270 pounds despite dietary changes and regular exercise. We also reviewed your history of prediabetes and current management strategies. Additionally, you requested screening for sexually transmitted infections and discussed your eczema flare-ups.  YOUR PLAN:  -ADULT WELLNESS VISIT: This visit was a routine check-up to monitor your overall health. We will screen for hepatitis C, administer a COVID-19 booster in the fall, and continue with annual flu vaccinations. Routine blood tests will be performed, and we will screen for sexually transmitted infections. You are also encouraged to have biennial eye exams and biannual dental cleanings.  -OBESITY: Obesity means having a body mass index (BMI) of 30 or higher. We discussed weight management options, including GLP-1 agonists, but they are not covered by your insurance. Instead, we will start you on phentermine  30 mg once daily. Continue your current exercise routine and make dietary changes, such as reducing high-sugar fruits and avoiding sugary drinks.  -PREDIABETES: Prediabetes is a condition where your blood sugar levels are higher than normal but not high enough to be classified as diabetes. Your A1c level is 5.7, which is an improvement from last year. To prevent progression to diabetes, continue focusing on weight loss, healthy eating, and regular exercise. We will monitor your A1c levels regularly.  -ECZEMA OF THE LEGS: Eczema is a condition that makes your skin red, inflamed, and itchy. You have been using over-the-counter creams, but we will now prescribe triamcinolone  cream for use as needed. You are also referred to a dermatologist for further evaluation.  INSTRUCTIONS:  Please follow up for routine blood tests, including CBC, lipid panel, renal, and hepatic function tests. Schedule your STI screening and  biennial eye exam. Continue with biannual dental cleanings. Monitor your A1c levels regularly and follow up with the dermatologist for your eczema.  Preventive Care 66-41 Years Old, Female Preventive care refers to lifestyle choices and visits with your health care provider that can promote health and wellness. Preventive care visits are also called wellness exams. What can I expect for my preventive care visit? Counseling During your preventive care visit, your health care provider may ask about your: Medical history, including: Past medical problems. Family medical history. Pregnancy history. Current health, including: Menstrual cycle. Method of birth control. Emotional well-being. Home life and relationship well-being. Sexual activity and sexual health. Lifestyle, including: Alcohol, nicotine or tobacco, and drug use. Access to firearms. Diet, exercise, and sleep habits. Work and work Astronomer. Sunscreen use. Safety issues such as seatbelt and bike helmet use. Physical exam Your health care provider may check your: Height and weight. These may be used to calculate your BMI (body mass index). BMI is a measurement that tells if you are at a healthy weight. Waist circumference. This measures the distance around your waistline. This measurement also tells if you are at a healthy weight and may help predict your risk of certain diseases, such as type 2 diabetes and high blood pressure. Heart rate and blood pressure. Body temperature. Skin for abnormal spots. What immunizations do I need?  Vaccines are usually given at various ages, according to a schedule. Your health care provider will recommend vaccines for you based on your age, medical history, and lifestyle or other factors, such as travel or where you work. What tests do I need? Screening Your health care provider may recommend screening tests for certain conditions. This may include: Pelvic exam and Pap test.  Lipid and  cholesterol levels. Diabetes screening. This is done by checking your blood sugar (glucose) after you have not eaten for a while (fasting). Hepatitis B test. Hepatitis C test. HIV (human immunodeficiency virus) test. STI (sexually transmitted infection) testing, if you are at risk. BRCA-related cancer screening. This may be done if you have a family history of breast, ovarian, tubal, or peritoneal cancers. Talk with your health care provider about your test results, treatment options, and if necessary, the need for more tests. Follow these instructions at home: Eating and drinking  Eat a healthy diet that includes fresh fruits and vegetables, whole grains, lean protein, and low-fat dairy products. Take vitamin and mineral supplements as recommended by your health care provider. Do not drink alcohol if: Your health care provider tells you not to drink. You are pregnant, may be pregnant, or are planning to become pregnant. If you drink alcohol: Limit how much you have to 0-1 drink a day. Know how much alcohol is in your drink. In the [place] one drink equals one 12 oz bottle of beer (355 mL), one 5 oz glass of wine (148 mL), or one 1 oz glass of hard liquor (44 mL). Lifestyle Brush your teeth every morning and night with fluoride toothpaste. Floss one time each day. Exercise for at least 30 minutes 5 or more days each week. Do not use any products that contain nicotine or tobacco. These products include cigarettes, chewing tobacco, and vaping devices, such as e-cigarettes. If you need help quitting, ask your health care provider. Do not use drugs. If you are sexually active, practice safe sex. Use a condom or other form of protection to prevent STIs. If you do not wish to become pregnant, use a form of birth control. If you plan to become pregnant, see your health care provider for a prepregnancy visit. Find healthy ways to manage stress, such as: Meditation, yoga, or listening to  music. Journaling. Talking to a trusted person. Spending time with friends and family. Minimize exposure to UV radiation to reduce your risk of skin cancer. Safety Always wear your seat belt while driving or riding in a vehicle. Do not drive: If you have been drinking alcohol. Do not ride with someone who has been drinking. If you have been using any mind-altering substances or drugs. While texting. When you are tired or distracted. Wear a helmet and other protective equipment during sports activities. If you have firearms in your house, make sure you follow all gun safety procedures. Seek help if you have been physically or sexually abused. What's next? Go to your health care provider once a year for an annual wellness visit. Ask your health care provider how often you should have your eyes and teeth checked. Stay up to date on all vaccines. This information is not intended to replace advice given to you by your health care provider. Make sure you discuss any questions you have with your health care provider. Document Revised: 03/19/2021 Document Reviewed: 03/19/2021 Elsevier Patient Education  2024 [employer].

## 2024-05-31 ENCOUNTER — Telehealth: Payer: Self-pay | Admitting: Internal Medicine

## 2024-05-31 ENCOUNTER — Ambulatory Visit: Payer: Self-pay | Admitting: Internal Medicine

## 2024-05-31 LAB — COMPREHENSIVE METABOLIC PANEL WITH GFR
ALT: 25 IU/L (ref 0–32)
AST: 21 IU/L (ref 0–40)
Albumin: 4.4 g/dL (ref 3.9–4.9)
Alkaline Phosphatase: 85 IU/L (ref 44–121)
BUN/Creatinine Ratio: 15 (ref 9–23)
BUN: 11 mg/dL (ref 6–20)
Bilirubin Total: 0.4 mg/dL (ref 0.0–1.2)
CO2: 24 mmol/L (ref 20–29)
Calcium: 10.4 mg/dL — ABNORMAL HIGH (ref 8.7–10.2)
Chloride: 102 mmol/L (ref 96–106)
Creatinine, Ser: 0.74 mg/dL (ref 0.57–1.00)
Globulin, Total: 2.9 g/dL (ref 1.5–4.5)
Glucose: 92 mg/dL (ref 70–99)
Potassium: 4.5 mmol/L (ref 3.5–5.2)
Sodium: 136 mmol/L (ref 134–144)
Total Protein: 7.3 g/dL (ref 6.0–8.5)
eGFR: 107 mL/min/1.73 (ref >59)

## 2024-05-31 LAB — CBC
Hematocrit: 44.9 % (ref 34.0–46.6)
Hemoglobin: 14.2 g/dL (ref 11.1–15.9)
MCH: 26.5 pg — ABNORMAL LOW (ref 26.6–33.0)
MCHC: 31.6 g/dL (ref 31.5–35.7)
MCV: 84 fL (ref 79–97)
Platelets: 219 x10E3/uL (ref 150–450)
RBC: 5.35 x10E6/uL — ABNORMAL HIGH (ref 3.77–5.28)
RDW: 11.8 % (ref 11.7–15.4)
WBC: 4.1 x10E3/uL (ref 3.4–10.8)

## 2024-05-31 LAB — HIV ANTIBODY (ROUTINE TESTING W REFLEX): HIV Screen 4th Generation wRfx: NONREACTIVE

## 2024-05-31 LAB — LIPID PANEL
Chol/HDL Ratio: 2.5 ratio (ref 0.0–4.4)
Cholesterol, Total: 160 mg/dL (ref 100–199)
HDL: 63 mg/dL (ref >39)
LDL Chol Calc (NIH): 83 mg/dL (ref 0–99)
Triglycerides: 70 mg/dL (ref 0–149)
VLDL Cholesterol Cal: 14 mg/dL (ref 5–40)

## 2024-05-31 LAB — HEPATITIS C ANTIBODY: Hep C Virus Ab: NONREACTIVE

## 2024-05-31 LAB — RPR: RPR Ser Ql: NONREACTIVE

## 2024-05-31 NOTE — Telephone Encounter (Signed)
 FYI

## 2024-05-31 NOTE — Telephone Encounter (Signed)
 Called & spoke to the patient. Verified name & DOB. Informed patient that we unfortunately have not found any keys in the room or office. Patient stated that she ended up finding them outside of the clinic. No further assistance at this time.

## 2024-05-31 NOTE — Telephone Encounter (Signed)
 Copied from CRM [id number]. Topic: General - Other >> May 30, 2024  6:01 PM Deleta RAMAN wrote:  Reason for CRM: Patient left keys in room 12 please contact [phone]

## 2024-06-01 LAB — CERVICOVAGINAL ANCILLARY ONLY
Bacterial Vaginitis (gardnerella): NEGATIVE
Chlamydia: NEGATIVE
Comment: NEGATIVE
Comment: NEGATIVE
Comment: NEGATIVE
Comment: NORMAL
Neisseria Gonorrhea: NEGATIVE
Trichomonas: NEGATIVE

## 2024-06-20 ENCOUNTER — Ambulatory Visit (HOSPITAL_BASED_OUTPATIENT_CLINIC_OR_DEPARTMENT_OTHER): Admitting: Obstetrics & Gynecology

## 2024-06-27 ENCOUNTER — Ambulatory Visit

## 2024-06-27 ENCOUNTER — Ambulatory Visit (HOSPITAL_BASED_OUTPATIENT_CLINIC_OR_DEPARTMENT_OTHER): Admitting: Obstetrics & Gynecology

## 2024-06-27 DIAGNOSIS — L910 Hypertrophic scar: Secondary | ICD-10-CM

## 2024-06-27 DIAGNOSIS — L209 Atopic dermatitis, unspecified: Secondary | ICD-10-CM

## 2024-06-27 DIAGNOSIS — L810 Postinflammatory hyperpigmentation: Secondary | ICD-10-CM

## 2024-06-27 DIAGNOSIS — L308 Other specified dermatitis: Secondary | ICD-10-CM

## 2024-06-27 NOTE — Patient Instructions (Signed)

## 2024-06-27 NOTE — Progress Notes (Deleted)
 GYNECOLOGY  VISIT  CC:   ***  HPI: 38 y.o. G67P1001 Single Black or African American female here for irritation of vagina ***.   Past Medical History:  Diagnosis Date   Anemia    Chlamydia    ECZEMA, ATOPIC DERMATITIS 12/02/2006   Qualifier: Diagnosis of  By: JANEY PARODY     Gonorrhea    Migraines    OBESITY, NOS 12/02/2006   Qualifier: Diagnosis of  By: JANEY PARODY     Palpitations    Sleep apnea     MEDS:   Current Outpatient Medications on File Prior to Visit  Medication Sig Dispense Refill   Biotin w/ Vitamins C & E (HAIR SKIN & NAILS GUMMIES PO) Take by mouth.     phentermine  30 MG capsule Take 1 capsule (30 mg total) by mouth every morning. 30 capsule 1   terbinafine  (LAMISIL ) 250 MG tablet Take 1 tablet (250 mg total) by mouth daily. 90 tablet 0   triamcinolone  cream (KENALOG ) 0.1 % Apply 1 Application topically 2 (two) times daily. 30 g 0   No current facility-administered medications on file prior to visit.    ALLERGIES: Patient has no known allergies.  SH:  ***  ROS  PHYSICAL EXAMINATION:    LMP 07/07/2022 (Within Weeks) Comment: DOS UPREG NEGATIVE    General appearance: alert, cooperative and appears stated age Neck: no adenopathy, supple, symmetrical, trachea midline and thyroid  {CHL AMB PHY EX THYROID  NORM DEFAULT:[phone]::normal to inspection and palpation} CV:  {Exam; heart brief:31539} Lungs:  {pe lungs ob:314451} Breasts: {Exam; breast:13139::normal appearance, no masses or tenderness} Abdomen: soft, non-tender; bowel sounds normal; no masses,  no organomegaly Lymph:  no inguinal LAD noted  Pelvic: External genitalia:  no lesions              Urethra:  normal appearing urethra with no masses, tenderness or lesions              Bartholins and Skenes: normal                 Vagina: {exam; pelvic vaginal:30846}              Cervix: {CHL AMB PHY EX CERVIX NORM DEFAULT:[id number]::no lesions}              Bimanual Exam:  Uterus:   {CHL AMB PHY EX UTERUS NORM DEFAULT:[phone]::normal size, contour, position, consistency, mobility, non-tender}              Adnexa: {CHL AMB PHY EX ADNEXA NO MASS DEFAULT:[phone]::no mass, fullness, tenderness}              Rectovaginal: {yes no:[id number]}.  Confirms.              Anus:  normal sphincter tone, no lesions  Chaperone, ***, CMA, was present for exam.  Assessment/Plan: There are no diagnoses linked to this encounter.

## 2024-06-27 NOTE — Progress Notes (Signed)
   New Patient Visit   Subjective  Julia Roberts is a 38 y.o. female who presents for the following: Rash - hx of eczema since childhood, currently flared on the legs and has been using TMC 0.1% for two weeks, and is starting to improve. Pt c/o keloid on the L ear and she would like to discuss tx options. Keloid not   The following portions of the chart were reviewed this encounter and updated as appropriate: medications, allergies, medical history  Review of Systems:  No other skin or systemic complaints except as noted in HPI or Assessment and Plan.  Objective  Well appearing patient in no apparent distress; mood and affect are within normal limits.  A focused examination was performed of the following areas: The face, legs, and L ear - hyperpigmented patch L anterior lower extremity - R posterior helix with soft brown papule          Assessment & Plan     Atopic dermatitis - mild c/b post inflammatory hyperpigmentation  Chronic stable condition  - Diagnosis, treatment options, prognosis, risk/ benefit, and side effects of treatment were discussed with the patient.  - Reviewed benign but chronic nature of disease. - Discussed dry skin care at length, recommended avoidance of fragrances, short showers with luke- warm water, no scrubbing, an unscented moisturizing soap (e.g. Dove sensitive skin) limited to the groin and axillae, and frequent emollient use (Eucerin, Aquaphor, Cerave, Vanicream, Vaseline). - Discussed treatment with topical steroids.  - Reviewed proper use of topical steroids to minimize the risk of steroid-induced skin changes.  - Also discussed appropriate dry skin care including daily warm baths with gentle soap, followed by liberal bland moisturizer application.  - For mild areas: D/C triamcinolone  0.1% ointment twice daily for now since eczema flare has resolve. May restart in the future BID PRN. Topical steroids (such as triamcinolone , fluocinolone,  fluocinonide, mometasone, clobetasol, halobetasol, betamethasone, hydrocortisone) can cause thinning and lightening of the skin if they are used for too long in the same area. Your physician has selected the right strength medicine for your problem and area affected on the body. Please use your medication only as directed by your physician to prevent side effects.  - encouraged sun protection   Keloid  - Discussed treatment options including topical and intralesional therapies as well as excision. After discussion of risks/benefits/side effects/alternatives, patient elected to monitor - can use triam 0.1% ointment prn for itch    Return if symptoms worsen or fail to improve.  LILLETTE Rosina Mayans, CMA, am acting as scribe for Lauraine JAYSON Kanaris, MD .   Documentation: I have reviewed the above documentation for accuracy and completeness, and I agree with the above.  Lauraine JAYSON Kanaris, MD

## 2024-07-11 ENCOUNTER — Ambulatory Visit: Admitting: Internal Medicine

## 2024-08-08 ENCOUNTER — Ambulatory Visit: Attending: Internal Medicine | Admitting: Internal Medicine

## 2024-08-08 ENCOUNTER — Encounter: Payer: Self-pay | Admitting: Internal Medicine

## 2024-08-08 DIAGNOSIS — Z6841 Body Mass Index (BMI) 40.0 and over, adult: Secondary | ICD-10-CM

## 2024-08-08 DIAGNOSIS — Z79899 Other long term (current) drug therapy: Secondary | ICD-10-CM | POA: Diagnosis not present

## 2024-08-08 DIAGNOSIS — Z2821 Immunization not carried out because of patient refusal: Secondary | ICD-10-CM

## 2024-08-08 MED ORDER — TOPIRAMATE 25 MG PO TABS: 25.0000 mg | ORAL_TABLET | Freq: Every day | ORAL | 3 refills | Status: DC

## 2024-08-08 MED ORDER — PHENTERMINE HCL 37.5 MG PO CAPS: 37.5000 mg | ORAL_CAPSULE | ORAL | 1 refills | Status: DC

## 2024-08-08 NOTE — Progress Notes (Signed)
 Patient ID: Julia Roberts, female    DOB: 04/06/1986  MRN: [id number]  CC: Weight Check (Phentermine  f/u - reports boost of energy /Reports healthier eating habits /No to flu vax)   Subjective: Julia Roberts is a 38 y.o. female who presents for chronic ds management. Her concerns today include:  Patient with history of OSA, eczema, fibroid with IDA s/p hysterectomy 07/2022, obesity/preDM   Discussed the use of AI scribe software for clinical note transcription with the patient, who gave verbal consent to proceed.  History of Present Illness Julia Roberts is a 38 year old female who presents for a follow-up visit regarding weight management.  She was last seen in August 2025 and was started on phentermine  30 mg for weight loss. She experiences increased energy when taking the medication in the morning before work. Her appetite fluctuates, but phentermine  helps decrease it slightly.  Her diet is inconsistent due to a busy work schedule. She eats oatmeal and yogurt on some days, and chili beans with salad on others, but sometimes does not eat at all. She works five days a week in [place] and commutes from [place], which takes about 40 minutes each way.  She has a history of sleep apnea but was taken off CPAP by her cardiologist over a year ago, as it was deemed unnecessary. She sleeps well at night without the machine.  She engages in physical activity by walking her dog for 30 minutes daily, although she describes the activity as 'stop and go' due to the dog's behavior.     Patient Active Problem List   Diagnosis Date Noted   Obstructive sleep apnea 12/26/2019   Palpitations 11/21/2019   History of anemia 11/24/2018   Cardiac murmur 11/24/2018   Class 2 obesity due to excess calories with body mass index (BMI) of 39.0 to 39.9 in adult 12/02/2006   ECZEMA, ATOPIC DERMATITIS 12/02/2006     Current Outpatient Medications on File Prior to Visit  Medication Sig  Dispense Refill   Biotin w/ Vitamins C & E (HAIR SKIN & NAILS GUMMIES PO) Take by mouth.     terbinafine  (LAMISIL ) 250 MG tablet Take 1 tablet (250 mg total) by mouth daily. 90 tablet 0   triamcinolone  cream (KENALOG ) 0.1 % Apply 1 Application topically 2 (two) times daily. 30 g 0   No current facility-administered medications on file prior to visit.    No Known Allergies  Social History   Socioeconomic History   Marital status: Single    Spouse name: Not on file   Number of children: 1   Years of education: some college   Highest education level: Associate degree: occupational, scientist, product/process development, or vocational program  Occupational History   Not on file  Tobacco Use   Smoking status: Never   Smokeless tobacco: Never  Vaping Use   Vaping status: Never Used  Substance and Sexual Activity   Alcohol use: No   Drug use: No   Sexual activity: Yes    Birth control/protection: None  Other Topics Concern   Not on file  Social History Narrative   Not on file   Social Drivers of Health   Financial Resource Strain: Low Risk  (08/08/2024)   Overall Financial Resource Strain (CARDIA)    Difficulty of Paying Living Expenses: Not hard at all  Food Insecurity: No Food Insecurity (08/08/2024)   Hunger Vital Sign    Worried About Running Out of Food in the Last Year: Never true  Ran Out of Food in the Last Year: Never true  Transportation Needs: No Transportation Needs (08/08/2024)   PRAPARE - Administrator, Civil Service (Medical): No    Lack of Transportation (Non-Medical): No  Physical Activity: Insufficiently Active (08/08/2024)   Exercise Vital Sign    Days of Exercise per Week: 3 days    Minutes of Exercise per Session: 30 min  Stress: No Stress Concern Present (08/08/2024)   [employer] of Occupational Health - Occupational Stress Questionnaire    Feeling of Stress: Only a little  Social Connections: Socially Isolated (08/08/2024)   Social Connection and Isolation  Panel    Frequency of Communication with Friends and Family: Twice a week    Frequency of Social Gatherings with Friends and Family: Once a week    Attends Religious Services: Never    Database Administrator or Organizations: No    Attends Banker Meetings: Never    Marital Status: Never married  Intimate Partner Violence: Not At Risk (08/08/2024)   Humiliation, Afraid, Rape, and Kick questionnaire    Fear of Current or Ex-Partner: No    Emotionally Abused: No    Physically Abused: No    Sexually Abused: No    Family History  Problem Relation Age of Onset   Migraines Mother    Hypertension Father    Diabetes Maternal Aunt     Past Surgical History:  Procedure Laterality Date   CYSTOSCOPY N/A 08/04/2022   Procedure: CYSTOSCOPY;  Surgeon: Cleotilde Ronal RAMAN, MD;  Location: [facility] OR;  Service: Gynecology;  Laterality: N/A;   TOTAL LAPAROSCOPIC HYSTERECTOMY WITH SALPINGECTOMY Bilateral 08/04/2022   Procedure: TOTAL LAPAROSCOPIC HYSTERECTOMY WITH BILATERAL SALPINGECTOMY;  Surgeon: Cleotilde Ronal RAMAN, MD;  Location: [facility] OR;  Service: Gynecology;  Laterality: Bilateral;    ROS: Review of Systems Negative except as stated above  PHYSICAL EXAM: BP 116/79 (BP Location: Left Arm, Patient Position: Sitting, Cuff Size: Large)   Pulse 72   Temp 98.2 F (36.8 C) (Oral)   Ht 5' 6 (1.676 m)   Wt 264 lb (119.7 kg)   LMP 07/07/2022 (Within Weeks) Comment: DOS UPREG NEGATIVE  SpO2 100%   BMI 42.61 kg/m   Wt Readings from Last 3 Encounters:  08/08/24 264 lb (119.7 kg)  05/30/24 270 lb (122.5 kg)  10/21/23 261 lb 12.8 oz (118.8 kg)    Physical Exam  General appearance - alert, well appearing, middle age AAF and in no distress Mental status - normal mood, behavior, speech, dress, motor activity, and thought processes      Latest Ref Rng & Units 05/30/2024    4:55 PM 01/29/2023   12:11 PM 07/31/2022    2:47 PM  CMP  Glucose 70 - 99 mg/dL 92  82  892   BUN 6 - 20 mg/dL 11  8  8     Creatinine 0.57 - 1.00 mg/dL 9.25  9.34  9.17   Sodium 134 - 144 mmol/L 136  137  138   Potassium 3.5 - 5.2 mmol/L 4.5  4.2  3.8   Chloride 96 - 106 mmol/L 102  103  109   CO2 20 - 29 mmol/L 24  22  26    Calcium 8.7 - 10.2 mg/dL 89.5  89.7  89.7   Total Protein 6.0 - 8.5 g/dL 7.3  7.0    Total Bilirubin 0.0 - 1.2 mg/dL 0.4  0.4    Alkaline Phos 44 - 121 IU/L 85  97  AST 0 - 40 IU/L 21  19    ALT 0 - 32 IU/L 25  16     Lipid Panel     Component Value Date/Time   CHOL 160 05/30/2024 1655   TRIG 70 05/30/2024 1655   HDL 63 05/30/2024 1655   CHOLHDL 2.5 05/30/2024 1655   LDLCALC 83 05/30/2024 1655    CBC    Component Value Date/Time   WBC 4.1 05/30/2024 1655   WBC 3.9 (L) 07/31/2022 1447   RBC 5.35 (H) 05/30/2024 1655   RBC 4.55 07/31/2022 1447   HGB 14.2 05/30/2024 1655   HCT 44.9 05/30/2024 1655   PLT 219 05/30/2024 1655   MCV 84 05/30/2024 1655   MCH 26.5 (L) 05/30/2024 1655   MCH 23.7 (L) 07/31/2022 1447   MCHC 31.6 05/30/2024 1655   MCHC 29.7 (L) 07/31/2022 1447   RDW 11.8 05/30/2024 1655   LYMPHSABS 1.4 07/28/2022 1606   MONOABS 0.2 05/06/2022 0930   EOSABS 0.1 07/28/2022 1606   BASOSABS 0.0 07/28/2022 1606    ASSESSMENT AND PLAN: 1. Morbid obesity (HCC) (Primary) Morbid obesity due to excess calories Morbid obesity with a 6-pound weight reduction since August. Currently on phentermine  30 mg, tolerating well without significant S.E. -- Advised dietary modifications: smaller portions, limit white carbs, avoid sugary drinks. -Continue daily walks. -Increase phentermine  to 37.5 mg daily.  We discussed adding topiramate at night to the phentermine  for better weight effect.  Advised that topiramate can cause some numbness and tingling hands or feet.  If this occurs she should stop the medicine and let me know.  She denies history of kidney stones or glaucoma. - phentermine  37.5 MG capsule; Take 1 capsule (37.5 mg total) by mouth every morning.  Dispense: 30  capsule; Refill: 1   Patient was given the opportunity to ask questions.  Patient verbalized understanding of the plan and was able to repeat key elements of the plan.   This documentation was completed using Paediatric nurse.  Any transcriptional errors are unintentional.  No orders of the defined types were placed in this encounter.    Requested Prescriptions   Signed Prescriptions Disp Refills   topiramate (TOPAMAX) 25 MG tablet 30 tablet 3    Sig: Take 1 tablet (25 mg total) by mouth at bedtime.   phentermine  37.5 MG capsule 30 capsule 1    Sig: Take 1 capsule (37.5 mg total) by mouth every morning.    Return in about 2 months (around 10/08/2024).  Barnie Louder, MD, FACP

## 2024-08-08 NOTE — Patient Instructions (Signed)
  VISIT SUMMARY: You came in today for a follow-up visit regarding your weight management. Since your last visit in August, you have lost 6 pounds. We discussed your current medication and made some adjustments to help you continue your weight loss journey.  YOUR PLAN: -MORBID OBESITY DUE TO EXCESS CALORIES: Morbid obesity means having a body weight that is significantly higher than what is considered healthy for your height. It can lead to various health issues. We increased your phentermine  dose to 37.5 mg and started you on topiramate 25 mg at bedtime to help with weight loss. Please continue taking your medications as prescribed. We also discussed making dietary changes, such as eating smaller portions, limiting white carbohydrates, and avoiding sugary drinks. Regular physical activity, like your daily 30-minute walks, is also encouraged.  INSTRUCTIONS: Please follow up with us  in a few months to monitor your progress. If you experience any side effects from the new medication or have any concerns, contact our office.                      Contains text generated by Abridge.                                 Contains text generated by Abridge.

## 2024-09-04 ENCOUNTER — Other Ambulatory Visit: Payer: Self-pay | Admitting: Internal Medicine

## 2024-09-04 ENCOUNTER — Encounter: Payer: Self-pay | Admitting: Internal Medicine

## 2024-09-04 MED ORDER — PHENTERMINE HCL 30 MG PO CAPS: 30.0000 mg | ORAL_CAPSULE | ORAL | 1 refills | Status: AC

## 2024-10-11 ENCOUNTER — Ambulatory Visit: Payer: Self-pay

## 2024-10-11 NOTE — Telephone Encounter (Signed)
 FYI Only or Action Required?: FYI only for provider: Has appt scheduled tomorrow.  Patient was last seen in primary care on 08/08/2024 by Vicci Barnie NOVAK, MD.  Called Nurse Triage reporting Chest Pain.  Symptoms began yesterday.  Interventions attempted: Rest, hydration, or home remedies.  Symptoms are: stable.  Triage Disposition: See HCP Within 4 Hours (Or PCP Triage)  Patient/caregiver understands and will follow disposition?:     Copied from CRM [id number]. Topic: Clinical - Red Word Triage >> Oct 11, 2024  8:13 AM Julia Roberts wrote: Red Word that prompted transfer to Nurse Triage: The patient is experiencing chest discomfort and abdominal pain and needs to reschedule their appointment Reason for Disposition  [1] MILD-MODERATE pain AND [2] constant AND [3] present > 2 hours  Answer Assessment - Initial Assessment Questions Patient called in to reschedule appt for tomorrow but, had upper abd/ lower chest discomfort yesterday 1 hr after eating. Today still feels pressure in abd with mild cramping.     1. LOCATION: Where does it hurt?      Pressure in abd and under breast bone 2. RADIATION: Does the pain shoot anywhere else? (e.g., chest, back)     no 3. ONSET: When did the pain begin? (e.g., minutes, hours or days ago)      yesterday 4. SUDDEN: Gradual or sudden onset?     sudden 5. PATTERN Does the pain come and go, or is it constant?     Has abd cramping now like she needs to have BM 6. SEVERITY: How bad is the pain?  (e.g., Scale 1-10; mild, moderate, or severe)     Mild discomforty 7. RECURRENT SYMPTOM: Have you ever had this type of stomach pain before? If Yes, ask: When was the last time? and What happened that time?      no 8. CAUSE: What do you think is causing the stomach pain? (e.g., gallstones, recent abdominal surgery)     Ate [name] last night 9. RELIEVING/AGGRAVATING FACTORS: What makes it better or worse? (e.g., antacids,  bending or twisting motion, bowel movement)     Drinking water helped 10. OTHER SYMPTOMS: Do you have any other symptoms? (e.g., back pain, diarrhea, fever, urination pain, vomiting)       denies  Protocols used: Abdominal Pain - Female-A-AH

## 2024-10-12 ENCOUNTER — Ambulatory Visit: Attending: Internal Medicine | Admitting: Internal Medicine

## 2024-10-12 ENCOUNTER — Encounter: Payer: Self-pay | Admitting: Internal Medicine

## 2024-10-12 DIAGNOSIS — Z6841 Body Mass Index (BMI) 40.0 and over, adult: Secondary | ICD-10-CM | POA: Diagnosis not present

## 2024-10-12 NOTE — Progress Notes (Signed)
 "   Patient ID: Julia Roberts, female    DOB: 1986/08/06  MRN: [id number]  CC: Obesity (Obesity f/u./Chest tightness, abdominal pain -  2 days ago Wyn received flu vax)   Subjective: Julia Roberts is a 39 y.o. female who presents for chronic ds management. Daughter, Jazel, is with her Her chronic medical issues include:  Patient with history of OSA, eczema, fibroid with IDA s/p hysterectomy 07/2022, obesity/preDM   Discussed the use of AI scribe software for clinical note transcription with the patient, who gave verbal consent to proceed.  History of Present Illness Julia Roberts is a 39 year old female who presents for a follow-up on weight management.  Obesity: Two months ago, her phentermine  dose was increased from 30 mg to 37.5 mg, but she experienced adverse effects, including chest pain and a rapid heartbeat, prompting her to revert to the 30 mg dose, which she tolerates well. She has recently requested a refill for her phentermine , with one refill remaining.  Over the holiday season, she struggled with her eating habits, resulting in a weight increase from 264 lbs to 266 lbs. She acknowledges the temptation of holiday foods but has since returned to healthier eating habits, primarily eating at home and occasionally taking lunch with her. Her physical activity has decreased; she admits to reduced exercise, including walking her dog, which she previously did for 30 minutes daily.  Two days ago, she experienced chest tightness and abdominal pain after consuming two rodeo burgers from [employer]. The pain was sharp and lasted 30-40 minutes, accompanied by nausea. The symptoms subsided after walking around and drinking water.    Patient Active Problem List   Diagnosis Date Noted   Obstructive sleep apnea 12/26/2019   Palpitations 11/21/2019   History of anemia 11/24/2018   Cardiac murmur 11/24/2018   Class 2 obesity due to excess calories with body mass index (BMI)  of 39.0 to 39.9 in adult 12/02/2006   ECZEMA, ATOPIC DERMATITIS 12/02/2006     Medications Ordered Prior to Encounter[1]  Allergies[2]  Social History   Socioeconomic History   Marital status: Single    Spouse name: Not on file   Number of children: 1   Years of education: some college   Highest education level: Associate degree: occupational, scientist, product/process development, or vocational program  Occupational History   Not on file  Tobacco Use   Smoking status: Never   Smokeless tobacco: Never  Vaping Use   Vaping status: Never Used  Substance and Sexual Activity   Alcohol use: No   Drug use: No   Sexual activity: Yes    Birth control/protection: None  Other Topics Concern   Not on file  Social History Narrative   Not on file   Social Drivers of Health   Tobacco Use: Low Risk (08/08/2024)   Patient History    Smoking Tobacco Use: Never    Smokeless Tobacco Use: Never    Passive Exposure: Not on file  Financial Resource Strain: Low Risk (08/08/2024)   Overall Financial Resource Strain (CARDIA)    Difficulty of Paying Living Expenses: Not hard at all  Food Insecurity: No Food Insecurity (08/08/2024)   Epic    Worried About Programme Researcher, Broadcasting/film/video in the Last Year: Never true    Ran Out of Food in the Last Year: Never true  Transportation Needs: No Transportation Needs (08/08/2024)   Epic    Lack of Transportation (Medical): No    Lack of Transportation (Non-Medical):  No  Physical Activity: Insufficiently Active (08/08/2024)   Exercise Vital Sign    Days of Exercise per Week: 3 days    Minutes of Exercise per Session: 30 min  Stress: No Stress Concern Present (08/08/2024)   [employer] of Occupational Health - Occupational Stress Questionnaire    Feeling of Stress: Only a little  Social Connections: Socially Isolated (08/08/2024)   Social Connection and Isolation Panel    Frequency of Communication with Friends and Family: Twice a week    Frequency of Social Gatherings with Friends  and Family: Once a week    Attends Religious Services: Never    Database Administrator or Organizations: No    Attends Banker Meetings: Never    Marital Status: Never married  Intimate Partner Violence: Not At Risk (08/08/2024)   Epic    Fear of Current or Ex-Partner: No    Emotionally Abused: No    Physically Abused: No    Sexually Abused: No  Depression (PHQ2-9): Low Risk (08/08/2024)   Depression (PHQ2-9)    PHQ-2 Score: 1  Recent Concern: Depression (PHQ2-9) - Medium Risk (05/30/2024)   Depression (PHQ2-9)    PHQ-2 Score: 5  Alcohol Screen: Low Risk (08/08/2024)   Alcohol Screen    Last Alcohol Screening Score (AUDIT): 0  Housing: Low Risk (08/08/2024)   Epic    Unable to Pay for Housing in the Last Year: No    Number of Times Moved in the Last Year: 1    Homeless in the Last Year: No  Utilities: Not At Risk (08/08/2024)   Epic    Threatened with loss of utilities: No  Health Literacy: Adequate Health Literacy (08/08/2024)   B1300 Health Literacy    Frequency of need for help with medical instructions: Never    Family History  Problem Relation Age of Onset   Migraines Mother    Hypertension Father    Diabetes Maternal Aunt     Past Surgical History:  Procedure Laterality Date   CYSTOSCOPY N/A 08/04/2022   Procedure: CYSTOSCOPY;  Surgeon: Cleotilde Ronal RAMAN, MD;  [facility] OR;  Service: Gynecology;  Laterality: N/A;   TOTAL LAPAROSCOPIC HYSTERECTOMY WITH SALPINGECTOMY Bilateral 08/04/2022   Procedure: TOTAL LAPAROSCOPIC HYSTERECTOMY WITH BILATERAL SALPINGECTOMY;  Surgeon: Cleotilde Ronal RAMAN, MD;  Location: [facility] OR;  Service: Gynecology;  Laterality: Bilateral;    ROS: Review of Systems Negative except as stated above  PHYSICAL EXAM: BP 121/79 (BP Location: Left Arm, Patient Position: Sitting, Cuff Size: Large)   Pulse 80   Temp 98.1 F (36.7 C) (Oral)   Ht 5' 6 (1.676 m)   Wt 266 lb (120.7 kg)   LMP 06/05/2022   SpO2 100%   BMI 42.93 kg/m   Wt  Readings from Last 3 Encounters:  10/12/24 266 lb (120.7 kg)  08/08/24 264 lb (119.7 kg)  05/30/24 270 lb (122.5 kg)    Physical Exam  General appearance - alert, well appearing, middle age obese AAF and in no distress Mental status - normal mood, behavior, speech, dress, motor activity, and thought processes Chest - clear to auscultation, no wheezes, rales or rhonchi, symmetric air entry Heart - normal rate, regular rhythm, normal S1, S2, no murmurs, rubs, clicks or gallops Extremities - peripheral pulses normal, no pedal edema, no clubbing or cyanosis      Latest Ref Rng & Units 05/30/2024    4:55 PM 01/29/2023   12:11 PM 07/31/2022    2:47 PM  CMP  Glucose 70 - 99 mg/dL 92  82  892   BUN 6 - 20 mg/dL 11  8  8    Creatinine 0.57 - 1.00 mg/dL 9.25  9.34  9.17   Sodium 134 - 144 mmol/L 136  137  138   Potassium 3.5 - 5.2 mmol/L 4.5  4.2  3.8   Chloride 96 - 106 mmol/L 102  103  109   CO2 20 - 29 mmol/L 24  22  26    Calcium 8.7 - 10.2 mg/dL 89.5  89.7  89.7   Total Protein 6.0 - 8.5 g/dL 7.3  7.0    Total Bilirubin 0.0 - 1.2 mg/dL 0.4  0.4    Alkaline Phos 44 - 121 IU/L 85  97    AST 0 - 40 IU/L 21  19    ALT 0 - 32 IU/L 25  16     Lipid Panel     Component Value Date/Time   CHOL 160 05/30/2024 1655   TRIG 70 05/30/2024 1655   HDL 63 05/30/2024 1655   CHOLHDL 2.5 05/30/2024 1655   LDLCALC 83 05/30/2024 1655    CBC    Component Value Date/Time   WBC 4.1 05/30/2024 1655   WBC 3.9 (L) 07/31/2022 1447   RBC 5.35 (H) 05/30/2024 1655   RBC 4.55 07/31/2022 1447   HGB 14.2 05/30/2024 1655   HCT 44.9 05/30/2024 1655   PLT 219 05/30/2024 1655   MCV 84 05/30/2024 1655   MCH 26.5 (L) 05/30/2024 1655   MCH 23.7 (L) 07/31/2022 1447   MCHC 31.6 05/30/2024 1655   MCHC 29.7 (L) 07/31/2022 1447   RDW 11.8 05/30/2024 1655   LYMPHSABS 1.4 07/28/2022 1606   MONOABS 0.2 05/06/2022 0930   EOSABS 0.1 07/28/2022 1606   BASOSABS 0.0 07/28/2022 1606    ASSESSMENT AND PLAN: 1.  Morbid obesity (HCC) (Primary) Patient admits to dietary indiscretions and not exercising much over the past several weeks.  She is committed to getting back on track with eating habits and exercise.  She would like to continue on the phentermine  30 mg daily for now.  Advise that we will reevaluate in 3 months.  If not much progress is made on the medicine we may have to stop it.    Patient was given the opportunity to ask questions.  Patient verbalized understanding of the plan and was able to repeat key elements of the plan.   This documentation was completed using Paediatric nurse.  Any transcriptional errors are unintentional.  No orders of the defined types were placed in this encounter.    Requested Prescriptions    No prescriptions requested or ordered in this encounter    Return in about 3 months (around 01/10/2025).  Barnie Louder, MD, FACP     [1]  Current Outpatient Medications on File Prior to Visit  Medication Sig Dispense Refill   Biotin w/ Vitamins C & E (HAIR SKIN & NAILS GUMMIES PO) Take by mouth.     phentermine  30 MG capsule Take 1 capsule (30 mg total) by mouth every morning. 30 capsule 1   triamcinolone  cream (KENALOG ) 0.1 % Apply 1 Application topically 2 (two) times daily. (Patient not taking: Reported on 10/12/2024) 30 g 0   No current facility-administered medications on file prior to visit.  [2] No Known Allergies  "

## 2024-10-12 NOTE — Patient Instructions (Signed)
" °  VISIT SUMMARY: During your visit, we discussed your weight management progress and the recent challenges you faced over the holiday season. We also addressed the adverse effects you experienced with the increased dose of phentermine  and your recent episode of chest tightness and abdominal pain.  YOUR PLAN: -OBESITY: Obesity is a condition characterized by excessive body fat that increases the risk of health problems. Your weight increased by 2 pounds, likely due to holiday eating habits and inconsistent exercise. We recommend continuing with your current dose of phentermine  at 30 mg daily. Additionally, it is important to maintain regular exercise, such as 30-minute daily walks with your dog. We will monitor your weight changes at your next visit.  -ADVERSE EFFECT OF PHENTERMINE : You experienced chest pain and a rapid heartbeat when your phentermine  dose was increased to 37.5 mg. These symptoms resolved after reducing the dose back to 30 mg. We will continue with the 30 mg daily dose of phentermine  to avoid these adverse effects.  INSTRUCTIONS: Please continue taking phentermine  30 mg daily and maintain regular exercise, including 30-minute daily walks with your dog. Monitor your weight and eating habits, and we will review your progress at your next visit.                      Contains text generated by [employer].                                 Contains text generated by [employer].   "

## 2024-10-18 ENCOUNTER — Encounter: Payer: Self-pay | Admitting: Internal Medicine

## 2024-10-18 ENCOUNTER — Other Ambulatory Visit: Payer: Self-pay | Admitting: Internal Medicine

## 2024-10-18 MED ORDER — OLOPATADINE HCL 0.1 % OP SOLN: 1.0000 [drp] | Freq: Two times a day (BID) | OPHTHALMIC | 0 refills | Status: AC

## 2024-11-29 ENCOUNTER — Ambulatory Visit: Admitting: Podiatry

## 2025-01-11 ENCOUNTER — Ambulatory Visit: Payer: Self-pay | Admitting: Internal Medicine
